# Patient Record
Sex: Female | Born: 1955
Health system: Southern US, Community
[De-identification: ages and names within clinical notes are randomized; demographics above are authoritative.]

## PROBLEM LIST (undated history)

## (undated) DIAGNOSIS — I10 Essential (primary) hypertension: Secondary | ICD-10-CM

## (undated) DIAGNOSIS — M1990 Unspecified osteoarthritis, unspecified site: Secondary | ICD-10-CM

## (undated) DIAGNOSIS — E785 Hyperlipidemia, unspecified: Secondary | ICD-10-CM

## (undated) DIAGNOSIS — B009 Herpesviral infection, unspecified: Secondary | ICD-10-CM

## (undated) DIAGNOSIS — H269 Unspecified cataract: Secondary | ICD-10-CM

## (undated) DIAGNOSIS — M8580 Other specified disorders of bone density and structure, unspecified site: Secondary | ICD-10-CM

## (undated) DIAGNOSIS — T7840XA Allergy, unspecified, initial encounter: Secondary | ICD-10-CM

## (undated) HISTORY — PX: LASIK: SHX215

## (undated) HISTORY — DX: Hyperlipidemia, unspecified: E78.5

## (undated) HISTORY — DX: Essential (primary) hypertension: I10

## (undated) HISTORY — PX: HYSTEROSCOPY: SHX211

## (undated) HISTORY — DX: Herpesviral infection, unspecified: B00.9

## (undated) HISTORY — DX: Unspecified cataract: H26.9

## (undated) HISTORY — DX: Other specified disorders of bone density and structure, unspecified site: M85.80

## (undated) HISTORY — DX: Unspecified osteoarthritis, unspecified site: M19.90

## (undated) HISTORY — DX: Allergy, unspecified, initial encounter: T78.40XA

## (undated) HISTORY — PX: COLONOSCOPY: SHX174

## (undated) HISTORY — PX: DILATION AND CURETTAGE OF UTERUS: SHX78

---

## 1997-04-30 HISTORY — PX: TUBAL LIGATION: SHX77

## 1999-01-10 ENCOUNTER — Other Ambulatory Visit: Admission: RE | Admit: 1999-01-10 | Discharge: 1999-01-10 | Payer: Self-pay | Admitting: Obstetrics and Gynecology

## 2000-01-15 ENCOUNTER — Other Ambulatory Visit: Admission: RE | Admit: 2000-01-15 | Discharge: 2000-01-15 | Payer: Self-pay | Admitting: Obstetrics and Gynecology

## 2000-03-04 ENCOUNTER — Encounter: Payer: Self-pay | Admitting: Obstetrics and Gynecology

## 2000-03-08 ENCOUNTER — Encounter (INDEPENDENT_AMBULATORY_CARE_PROVIDER_SITE_OTHER): Payer: Self-pay | Admitting: Specialist

## 2000-03-08 ENCOUNTER — Ambulatory Visit (HOSPITAL_COMMUNITY): Admission: RE | Admit: 2000-03-08 | Discharge: 2000-03-08 | Payer: Self-pay | Admitting: Obstetrics and Gynecology

## 2000-10-25 ENCOUNTER — Encounter: Admission: RE | Admit: 2000-10-25 | Discharge: 2000-10-25 | Payer: Self-pay | Admitting: Family Medicine

## 2000-10-25 ENCOUNTER — Encounter: Payer: Self-pay | Admitting: Family Medicine

## 2001-01-15 ENCOUNTER — Other Ambulatory Visit: Admission: RE | Admit: 2001-01-15 | Discharge: 2001-01-15 | Payer: Self-pay | Admitting: Obstetrics and Gynecology

## 2001-04-07 ENCOUNTER — Encounter: Admission: RE | Admit: 2001-04-07 | Discharge: 2001-04-07 | Payer: Self-pay | Admitting: Family Medicine

## 2001-04-07 ENCOUNTER — Encounter: Payer: Self-pay | Admitting: Family Medicine

## 2002-01-16 ENCOUNTER — Other Ambulatory Visit: Admission: RE | Admit: 2002-01-16 | Discharge: 2002-01-16 | Payer: Self-pay | Admitting: Obstetrics and Gynecology

## 2002-04-30 HISTORY — PX: VAGINAL HYSTERECTOMY: SUR661

## 2002-08-13 ENCOUNTER — Encounter: Payer: Self-pay | Admitting: Obstetrics and Gynecology

## 2002-08-20 ENCOUNTER — Encounter (INDEPENDENT_AMBULATORY_CARE_PROVIDER_SITE_OTHER): Payer: Self-pay | Admitting: Specialist

## 2002-08-21 ENCOUNTER — Inpatient Hospital Stay (HOSPITAL_COMMUNITY): Admission: RE | Admit: 2002-08-21 | Discharge: 2002-08-22 | Payer: Self-pay | Admitting: Obstetrics and Gynecology

## 2003-01-18 ENCOUNTER — Other Ambulatory Visit: Admission: RE | Admit: 2003-01-18 | Discharge: 2003-01-18 | Payer: Self-pay | Admitting: Obstetrics and Gynecology

## 2004-01-25 ENCOUNTER — Other Ambulatory Visit: Admission: RE | Admit: 2004-01-25 | Discharge: 2004-01-25 | Payer: Self-pay | Admitting: Obstetrics and Gynecology

## 2005-01-25 ENCOUNTER — Other Ambulatory Visit: Admission: RE | Admit: 2005-01-25 | Discharge: 2005-01-25 | Payer: Self-pay | Admitting: Obstetrics and Gynecology

## 2006-01-30 ENCOUNTER — Other Ambulatory Visit: Admission: RE | Admit: 2006-01-30 | Discharge: 2006-01-30 | Payer: Self-pay | Admitting: Obstetrics and Gynecology

## 2006-07-09 ENCOUNTER — Encounter: Admission: RE | Admit: 2006-07-09 | Discharge: 2006-07-09 | Payer: Self-pay | Admitting: Family Medicine

## 2007-02-03 ENCOUNTER — Other Ambulatory Visit: Admission: RE | Admit: 2007-02-03 | Discharge: 2007-02-03 | Payer: Self-pay | Admitting: Obstetrics and Gynecology

## 2007-06-05 ENCOUNTER — Encounter: Admission: RE | Admit: 2007-06-05 | Discharge: 2007-06-05 | Payer: Self-pay | Admitting: Family Medicine

## 2008-02-04 ENCOUNTER — Encounter: Payer: Self-pay | Admitting: Obstetrics and Gynecology

## 2008-02-04 ENCOUNTER — Ambulatory Visit: Payer: Self-pay | Admitting: Obstetrics and Gynecology

## 2008-02-04 ENCOUNTER — Other Ambulatory Visit: Admission: RE | Admit: 2008-02-04 | Discharge: 2008-02-04 | Payer: Self-pay | Admitting: Obstetrics and Gynecology

## 2008-03-15 ENCOUNTER — Ambulatory Visit: Payer: Self-pay | Admitting: Obstetrics and Gynecology

## 2009-02-07 ENCOUNTER — Ambulatory Visit: Payer: Self-pay | Admitting: Obstetrics and Gynecology

## 2009-02-07 ENCOUNTER — Other Ambulatory Visit: Admission: RE | Admit: 2009-02-07 | Discharge: 2009-02-07 | Payer: Self-pay | Admitting: Obstetrics and Gynecology

## 2009-02-07 ENCOUNTER — Encounter: Payer: Self-pay | Admitting: Obstetrics and Gynecology

## 2009-03-07 ENCOUNTER — Encounter (INDEPENDENT_AMBULATORY_CARE_PROVIDER_SITE_OTHER): Payer: Self-pay | Admitting: *Deleted

## 2009-03-17 ENCOUNTER — Encounter (INDEPENDENT_AMBULATORY_CARE_PROVIDER_SITE_OTHER): Payer: Self-pay | Admitting: *Deleted

## 2009-03-21 ENCOUNTER — Ambulatory Visit: Payer: Self-pay | Admitting: Gastroenterology

## 2009-03-21 ENCOUNTER — Encounter (INDEPENDENT_AMBULATORY_CARE_PROVIDER_SITE_OTHER): Payer: Self-pay | Admitting: *Deleted

## 2009-03-30 ENCOUNTER — Ambulatory Visit: Payer: Self-pay | Admitting: Gastroenterology

## 2010-02-08 ENCOUNTER — Ambulatory Visit: Payer: Self-pay | Admitting: Obstetrics and Gynecology

## 2010-02-08 ENCOUNTER — Other Ambulatory Visit: Admission: RE | Admit: 2010-02-08 | Discharge: 2010-02-08 | Payer: Self-pay | Admitting: Obstetrics and Gynecology

## 2010-04-11 ENCOUNTER — Ambulatory Visit: Payer: Self-pay | Admitting: Obstetrics and Gynecology

## 2010-08-01 LAB — GLUCOSE, CAPILLARY
Glucose-Capillary: 147 mg/dL — ABNORMAL HIGH (ref 70–99)
Glucose-Capillary: 168 mg/dL — ABNORMAL HIGH (ref 70–99)

## 2010-09-15 NOTE — Op Note (Signed)
   [name], [name]                            ACCOUNT NO.:  [id number]   MEDICAL RECORD NO.:  [id number]                   PATIENT TYPE:  AMB   LOCATION:  DAY                                  FACILITY:  [facility]   PHYSICIAN:  Daniel L. Eda Paschal, M.D.           DATE OF BIRTH:  May 30, 1955   DATE OF PROCEDURE:  08/20/2002  DATE OF DISCHARGE:                                 OPERATIVE REPORT   PREOPERATIVE DIAGNOSES:  Menometorrhagia with fibroid.   POSTOPERATIVE DIAGNOSES:  Menometorrhagia with fibroid.   OPERATION:  Vaginal hysterectomy.   SURGEON:  Daniel L. Eda Paschal, M.D.   FIRST ASSISTANT:  Raynald Kemp, M.D.   ANESTHESIA:  General endotracheal.   FINDINGS:  At the time of surgery, the patient's uterus was boggy and  enlarged consistent with her findings on ultrasound of a submucous myoma.  Ovaries, fallopian tubes and pelvic peritoneum were free of any disease.   DESCRIPTION OF PROCEDURE:  After adequate general endotracheal anesthesia,  the patient was placed in the dorsal lithotomy position, prepped and draped  in the usual sterile manner. A 360 degree incision was made around the  cervix after 0.5% Xylocaine with 1:200,000 epinephrine was injected around  the cervix. Both the vesicouterine fold to peritoneum and posterior  peritoneum were entered by sharp dissection. A Foley catheter was placed in  the bladder which drained clear urine. At this point after the bladder flap  had been opened, the uterosacral ligaments were clamped, in clamping them  they were shortened. They were sutured to the vault laterally for good vault  support. The cardinal ligaments, uterine arteries, balance of the broad  ligaments, uteroovarian ligaments and round ligaments and fallopian tubes  were successfully clamped, cut and suture ligated. All major vascular  bundles were doubly ligated, suture material was #1 chromic catgut. The  uterus was sent to pathology for tissue diagnosis.  Irrigation was done with  Ringer's lactate. Two sponge, needle and instrument counts were correct. The  peritoneum was sutured to the vaginal cuff with a running locking #0 Vicryl.  The peritoneum and the cuff were closed with figure-of-eights of #1 chromic  catgut taking care to completely obliterate the cul-de-sac. Two sponge,  needle and instrument counts were correct. Estimated blood loss was 50 mL  with none replaced. The patient tolerated the procedure well and left the  operating room in satisfactory condition draining clear urine from her Foley  catheter.                                               Daniel L. Eda Paschal, M.D.    Tonette Bihari  D:  08/20/2002  T:  08/20/2002  Job:  [id number]

## 2010-09-15 NOTE — Discharge Summary (Signed)
   [name], [name]                            ACCOUNT NO.:  [id number]   MEDICAL RECORD NO.:  [id number]                   PATIENT TYPE:  INP   LOCATION:  0460                                 FACILITY:  [facility]   PHYSICIAN:  Rande Brunt. Eda Paschal, M.D.           DATE OF BIRTH:  Sep 03, 1955   DATE OF ADMISSION:  08/20/2002  DATE OF DISCHARGE:  08/22/2002                                 DISCHARGE SUMMARY   HOSPITAL COURSE:  The patient is a 55 year old female who was admitted to  the hospital with multiple myomas and dysfunctional uterine bleeding not  responsive to medical therapy. On the day of admission, she was taken to the  operating room, a vaginal hysterectomy was performed. Postoperatively, she  had some trouble with nausea and inability to tolerate a p.o. diet. IV's  were continued and by the second postoperative day, all of this had cleared,  she was eating well, voiding well and was ready for discharge.   DISCHARGE DIET:  Regular.   DISCHARGE ACTIVITIES:  Ambulatory.   CONDITION ON DISCHARGE:  Improved.   DISCHARGE MEDICATIONS:  Tylox for pain relief.   Final pathology report is not in chart at time of dictation.   DISCHARGE DIAGNOSIS:  Dysfunctional uterine bleeding with leiomyoma.   OPERATION:  Vaginal hysterectomy and excision of small paraovarian cyst.                                               Daniel L. Eda Paschal, M.D.    [name]  D:  08/22/2002  T:  08/22/2002  Job:  [id number]

## 2010-09-15 NOTE — H&P (Signed)
NAMEJESSAMINE, [name]                            ACCOUNT NO.:  [id number]   MEDICAL RECORD NO.:  [id number]                   PATIENT TYPE:  OBV   LOCATION:  0460                                 FACILITY:  [facility]   PHYSICIAN:  Rande Brunt. Eda Paschal, M.D.           DATE OF BIRTH:  November 01, 1955   DATE OF ADMISSION:  08/20/2002  DATE OF DISCHARGE:                                HISTORY & PHYSICAL   CHIEF COMPLAINT:  Menometorrhagia with myoma.   HISTORY OF PRESENT ILLNESS:  The patient is a 55 year old gravida I, para 0,  AB 1 with a history of severe menometorrhagia. It will last 8-10 days. It is  exceedingly heavy and it also is associated with significant dysmenorrhea.  It has not responded to oral progestins and as a result of that, she  underwent saline infusion histogram which shows a large submucous myoma  which is not confined to the cavity. It is felt that that is the source of  the above. She is now admitted to [facility] for vaginal  hysterectomy for treatment of the above. She understands that there is a  small chance that she will need an incision. We will save her ovaries unless  there is any abnormality as per her desires. Endometrial biopsy was also  done because of the menometorrhagia and did not show hyperplasia or  malignancy. It should be noted that the patient had a previous history of  DUB. Had undergone a hysteroscopy and D&C in November of 2001 with excision  of an endometrial polyp. At that time, she also had a submucous myomata  which was not in the cavity but it has grown significantly since then.   PAST MEDICAL HISTORY:  The patient is hypertensive on Accupril 40 mg daily.   ALLERGIES:  PENICILLIN.   FAMILY HISTORY:  Mother, father, brother, and aunt are all diabetic. Mother  is hypertensive and also has coronary artery disease.   SOCIAL HISTORY:  She drinks a couple times per week. Does not use tobacco.   REVIEW OF SYSTEMS:  HEENT:  Negative. Cardiac: Reveals hypertension.  Respiratory is negative. GI is negative. GU is negative. Neuro and  psychiatric is negative. Immunologic, lymphatic and endocrine is negative.   PHYSICAL EXAMINATION:  GENERAL: A well developed, well nourished female in  no acute distress.  VITAL SIGNS: Blood pressure 130/70, pulse 80 and regular. Respiratory rate  16 and non-labored. She is afebrile.  HEENT: Within normal limits.  NECK: Supple. Trachea midline. Thyroid not enlarged.  LUNGS: Clear to auscultation and percussion.  HEART: No thrills, heaves, or murmurs.  BREAST: No masses.  ABDOMEN: Soft without guarding, rebound, or masses.  PELVIC: External and vaginal within normal limits. Cervix clean. PAP smear  shows no atypia. Uterus slightly enlarged. Adnexa are palpably normal.  RECTAL: Negative.    ADMISSION IMPRESSION:  Menometorrhagia, submucous myoma not confined to  cavity.  PLAN:  Vaginal hysterectomy.                                               Daniel L. Eda Paschal, M.D.    Tonette Bihari  D:  08/20/2002  T:  08/20/2002  Job:  [id number]

## 2010-09-15 NOTE — Op Note (Signed)
[facility]  Patient:    [name], [name]                           MRN: [id number] Proc. Date: 03/08/00 Adm. Date:  [id number] Attending:  Sharon Mt                           Operative Report  PREOPERATIVE DIAGNOSIS:  Menorrhagia with intrauterine lesion.  POSTOPERATIVE DIAGNOSIS:  Menorrhagia with intrauterine lesion.  OPERATION:  Hysteroscopy with resection of intrauterine lesion.  SURGEON:  Daniel L. Eda Paschal, M.D.  ANESTHESIA:  General.  INDICATIONS:  The patient is a 55 year old female who presented to the office with severe menorrhagia.  She underwent a sonohistogram in the office, and she has a filling defect in her endometrial cavity of about 2 cm coming off the posterior wall.  It is consistent with either a submucous myoma or an endometrial polyp.  She now enters the hospital for hysteroscopy and resection of the above.  FINDINGS:  External and vaginal exam within normal limits.  Cervix is clean. Uterus is normal size and shape.  There is first degree uterine decensus. Adnexa are palpably normal.  At the time of hysteroscopy, the patient had a large intracavitary lesion that was confined to the endometrial cavity of about 2+ cm.  It was on the posterior wall of the fundus.  Its consistency was firm, and it was felt that it possibly was a myoma rather than endometrial polyp.  Once it was resected, the top of the fundus, tubal ostia, anterior and posterior walls of the fundus, lower uterine segment, and the cervical canal were all free of any disease.  DESCRIPTION OF PROCEDURE:  After adequate general endotracheal anesthesia, the patient was placed in the dorsosupine position and prepped and draped in the usual sterile manner.  The cervix was dilated to a #35 Pratt dilator, and hysteroscopic resectoscope was introduced.  It was attached to a camera for magnification to improve intrauterine pressure, and 3% Sorbitol was used  to expand the intrauterine cavity.  An excellent view was obtained, and a large lesion coming off the posterior wall was seen as described above.  Pictures were taken for documentation.  Using a 90 degree wire loop with setting 70 coag, 110 cutting, blend 1, the lesion was resected in pieces and then sent to pathology for tissue diagnosis.  The base was cauterized to stop bleeding.  At the termination of the procedure, there was no bleeding noted, and there were no other lesions present in the cavity.  Estimated blood loss for the entire procedure was 50 cc with none replaced.  Fluid deficit was approximately 100 cc.  The patient tolerated the procedure well and left the operating room in satisfactory condition. DD:  03/08/00 TD:  03/08/00 Job: [id number]

## 2011-02-07 DIAGNOSIS — D219 Benign neoplasm of connective and other soft tissue, unspecified: Secondary | ICD-10-CM | POA: Insufficient documentation

## 2011-02-20 ENCOUNTER — Encounter: Payer: Self-pay | Admitting: Obstetrics and Gynecology

## 2011-02-20 ENCOUNTER — Ambulatory Visit (INDEPENDENT_AMBULATORY_CARE_PROVIDER_SITE_OTHER): Payer: 59 | Admitting: Obstetrics and Gynecology

## 2011-02-20 ENCOUNTER — Other Ambulatory Visit (HOSPITAL_COMMUNITY)
Admission: RE | Admit: 2011-02-20 | Discharge: 2011-02-20 | Disposition: A | Discharge status: Home / Self Care | Payer: 59 | Source: Ambulatory Visit | Attending: Obstetrics and Gynecology | Admitting: Obstetrics and Gynecology

## 2011-02-20 VITALS — BP 156/90 | Ht 64.5 in | Wt 216.0 lb

## 2011-02-20 DIAGNOSIS — Z01419 Encounter for gynecological examination (general) (routine) without abnormal findings: Secondary | ICD-10-CM | POA: Insufficient documentation

## 2011-02-20 DIAGNOSIS — I10 Essential (primary) hypertension: Secondary | ICD-10-CM | POA: Insufficient documentation

## 2011-02-20 DIAGNOSIS — Z780 Asymptomatic menopausal state: Secondary | ICD-10-CM

## 2011-02-20 DIAGNOSIS — N951 Menopausal and female climacteric states: Secondary | ICD-10-CM

## 2011-02-20 DIAGNOSIS — M949 Disorder of cartilage, unspecified: Secondary | ICD-10-CM

## 2011-02-20 DIAGNOSIS — Z23 Encounter for immunization: Secondary | ICD-10-CM

## 2011-02-20 DIAGNOSIS — M8580 Other specified disorders of bone density and structure, unspecified site: Secondary | ICD-10-CM

## 2011-02-20 DIAGNOSIS — M899 Disorder of bone, unspecified: Secondary | ICD-10-CM

## 2011-02-20 MED ORDER — ESTRADIOL 0.0375 MG/24HR TD PTTW: 1.0000 | MEDICATED_PATCH | TRANSDERMAL | Status: DC

## 2011-02-20 NOTE — Progress Notes (Signed)
Patient came to see me today for her annual GYN exam. She's been struggling with hot flashes and mood swings. She is tried over-the-counter medication but is now working. She has low bone mass but it is improved on her last bone density and she does not have an elevated FRAX risk. She takes calcium and vitamin D. She's had no fractures. She has her yearly mammogram scheduled for tomorrow.  ROS: Pertinent positives include hypertension and diabetes. She is on a statin but is for prevention and not elevated cholesterol.  Physical examination: Kennon Portela present HEENT within normal limits. Neck: Thyroid not large. No masses. Supraclavicular nodes: not enlarged. Breasts: Examined in both sitting midline position. No skin changes and no masses. Abdomen: Soft no guarding rebound or masses or hernia. Pelvic: External: Within normal limits. BUS: Within normal limits. Vaginal:within normal limits. Good estrogen effect. No evidence of cystocele rectocele or enterocele. Cervix and uterus surgically absent.  Adnexa: No masses. Rectovaginal exam: Confirmatory and negative. Extremities: Within normal limits.  Assessment: Menopausal symptoms  Plan: Patient started Vivelle dot patch 0.0375 mg twice a week. She knows to call if a higher dose as needed.

## 2011-02-21 ENCOUNTER — Encounter: Payer: Self-pay | Admitting: Obstetrics and Gynecology

## 2012-02-25 ENCOUNTER — Encounter: Payer: Self-pay | Admitting: Obstetrics and Gynecology

## 2012-02-25 ENCOUNTER — Ambulatory Visit (INDEPENDENT_AMBULATORY_CARE_PROVIDER_SITE_OTHER): Payer: 59 | Admitting: Obstetrics and Gynecology

## 2012-02-25 VITALS — BP 156/94 | Ht 64.5 in | Wt 200.0 lb

## 2012-02-25 DIAGNOSIS — Z01419 Encounter for gynecological examination (general) (routine) without abnormal findings: Secondary | ICD-10-CM

## 2012-02-25 DIAGNOSIS — Z23 Encounter for immunization: Secondary | ICD-10-CM

## 2012-02-25 DIAGNOSIS — R232 Flushing: Secondary | ICD-10-CM

## 2012-02-25 DIAGNOSIS — N951 Menopausal and female climacteric states: Secondary | ICD-10-CM

## 2012-02-25 MED ORDER — ESTRADIOL 0.0375 MG/24HR TD PTTW: 1.0000 | MEDICATED_PATCH | TRANSDERMAL | Status: DC

## 2012-02-25 NOTE — Addendum Note (Signed)
Addended by: Dayna Barker on: 02/25/2012 10:59 AM   Modules accepted: Orders

## 2012-02-25 NOTE — Patient Instructions (Signed)
Mammogram today. Monitor blood pressure at home.

## 2012-02-25 NOTE — Progress Notes (Signed)
Patient came to see me today for her annual GYN exam. Last year we started her on an estrogen patch for hot flashes which has worked very well. She is status post vaginal hysterectomy done in 2004 for  menorrhagia and fibroids. She has never had an abnormal Pap smear. Her last Pap smear was 2012. She does lab through her PCP. She is having her yearly mammogram today. She has had 3 bone densities. She does have low bone mass but her last bone density in 2011 was better and it is minimal. Her fracture risk is not elevated. She's had no fractures.  HEENT: Within normal limits.Kennon Portela present. Neck: No masses. Supraclavicular lymph nodes: Not enlarged. Breasts: Examined in both sitting and lying position. Symmetrical without skin changes or masses. Abdomen: Soft no masses guarding or rebound. No hernias. Pelvic: External within normal limits. BUS within normal limits. Vaginal examination shows good estrogen effect, no cystocele enterocele or rectocele. Cervix and uterus absent. Adnexa within normal limits. Rectovaginal confirmatory. Extremities within normal limits.  Assessment: Vasomotor symptoms  Plan: Continue estrogen patch. Continue yearly mammograms. Plan bone density for next year. Pap  not done.The new Pap smear guidelines were discussed with the patient.

## 2012-02-26 ENCOUNTER — Encounter: Payer: Self-pay | Admitting: Obstetrics and Gynecology

## 2012-02-26 LAB — URINALYSIS W MICROSCOPIC + REFLEX CULTURE
Glucose, UA: NEGATIVE mg/dL
Hgb urine dipstick: NEGATIVE
Leukocytes, UA: NEGATIVE
Protein, ur: NEGATIVE mg/dL

## 2013-01-14 ENCOUNTER — Other Ambulatory Visit: Payer: Self-pay | Admitting: Obstetrics and Gynecology

## 2013-02-27 ENCOUNTER — Encounter: Payer: Self-pay | Admitting: Gynecology

## 2013-02-27 ENCOUNTER — Ambulatory Visit (INDEPENDENT_AMBULATORY_CARE_PROVIDER_SITE_OTHER): Payer: 59 | Admitting: Gynecology

## 2013-02-27 VITALS — BP 120/78 | Ht 64.0 in | Wt 197.0 lb

## 2013-02-27 DIAGNOSIS — M899 Disorder of bone, unspecified: Secondary | ICD-10-CM

## 2013-02-27 DIAGNOSIS — Z79890 Hormone replacement therapy (postmenopausal): Secondary | ICD-10-CM

## 2013-02-27 DIAGNOSIS — Z01419 Encounter for gynecological examination (general) (routine) without abnormal findings: Secondary | ICD-10-CM

## 2013-02-27 DIAGNOSIS — M8580 Other specified disorders of bone density and structure, unspecified site: Secondary | ICD-10-CM

## 2013-02-27 DIAGNOSIS — Z23 Encounter for immunization: Secondary | ICD-10-CM

## 2013-02-27 DIAGNOSIS — N951 Menopausal and female climacteric states: Secondary | ICD-10-CM

## 2013-02-27 MED ORDER — ESTRADIOL 0.0375 MG/24HR TD PTTW: MEDICATED_PATCH | TRANSDERMAL | Status: DC

## 2013-02-27 NOTE — Progress Notes (Signed)
[name] [birth year] [id number]        57 y.o.  G1P0010 for annual exam.  Former patient of Dr. Eda Paschal. Several issues noted below.  Past medical history,surgical history, problem list, medications, allergies, family history and social history were all reviewed and documented in the EPIC chart.  ROS:  Performed and pertinent positives and negatives are included in the history, assessment and plan .  Exam: [name] Filed Vitals:   02/27/13 1057  BP: 120/78  Height: 5\' 4"  (1.626 m)  Weight: 197 lb (89.359 kg)   General appearance  Normal Skin grossly normal Head/Neck normal with no cervical or supraclavicular adenopathy thyroid normal Lungs  clear Cardiac RR, without RMG Abdominal  soft, nontender, without masses, organomegaly or hernia Breasts  examined lying and sitting without masses, retractions, discharge or axillary adenopathy. Pelvic  Ext/BUS/vagina  normal with mild atrophic changes   Adnexa  Without masses or tenderness    Anus and perineum  normal   Rectovaginal  normal sphincter tone without palpated masses or tenderness.    Assessment/Plan:  57 y.o. G1P0010 female for annual exam.   1. Menopausal symptoms status post [facility] 2004. Currently on Minivelle 0.0375 doing well. Started because of hot flashes, sleep disturbances and irritability which were all controlled on the patch.  I reviewed the whole issue of HRT with her to include the WHI study with increased risk of stroke, heart attack, DVT and breast cancer. The ACOG and NAMS statements for lowest dose for the shortest period of time reviewed. Transdermal versus oral first-pass effect benefit discussed. Patient understands the above and wants to continue and I refilled her x1 year. 2. Osteopenia. DEXA 03/2010 with T score -1.3 FRAX 5.5%/0.3%. Repeat DEXA now and patient will schedule. Increase calcium vitamin D reviewed. 3. Mammography scheduled today. Continue with annual mammography. SBE monthly  reviewed. 4. Pap smear 2012. No Pap smear done today. No history of significant abnormal Pap smears. Review current screening guidelines. Options to stop screening altogether or less frequent screening intervals. 5. Colonoscopy 2010. Repeat at their recommended interval. 6. Health maintenance. No blood work done today as is all done through her other physician's offices who follows her for her medical issues. Followup one year, sooner as needed.  Note: This document was prepared with digital dictation and possible smart phrase technology. Any transcriptional errors that result from this process are unintentional.   [name] MD, 11:21 AM 02/27/2013

## 2013-02-27 NOTE — Patient Instructions (Signed)
Follow up in one year for annual exam 

## 2013-02-27 NOTE — Addendum Note (Signed)
Addended by: Dayna Barker on: 02/27/2013 12:12 PM   Modules accepted: Orders

## 2013-02-28 LAB — URINALYSIS W MICROSCOPIC + REFLEX CULTURE
Bacteria, UA: NONE SEEN
Bilirubin Urine: NEGATIVE
Casts: NONE SEEN
Crystals: NONE SEEN
Glucose, UA: NEGATIVE mg/dL
Hgb urine dipstick: NEGATIVE
Ketones, ur: NEGATIVE mg/dL
pH: 6 (ref 5.0–8.0)

## 2013-03-02 ENCOUNTER — Encounter: Payer: Self-pay | Admitting: Gynecology

## 2013-03-03 ENCOUNTER — Other Ambulatory Visit: Payer: Self-pay | Admitting: *Deleted

## 2013-03-03 DIAGNOSIS — R928 Other abnormal and inconclusive findings on diagnostic imaging of breast: Secondary | ICD-10-CM

## 2013-03-12 ENCOUNTER — Other Ambulatory Visit: Payer: Self-pay | Admitting: Gynecology

## 2013-03-12 DIAGNOSIS — M8580 Other specified disorders of bone density and structure, unspecified site: Secondary | ICD-10-CM

## 2013-03-13 ENCOUNTER — Other Ambulatory Visit: Payer: Self-pay | Admitting: *Deleted

## 2013-03-13 DIAGNOSIS — R928 Other abnormal and inconclusive findings on diagnostic imaging of breast: Secondary | ICD-10-CM

## 2013-03-30 DIAGNOSIS — M8580 Other specified disorders of bone density and structure, unspecified site: Secondary | ICD-10-CM

## 2013-03-30 HISTORY — DX: Other specified disorders of bone density and structure, unspecified site: M85.80

## 2013-04-21 ENCOUNTER — Ambulatory Visit (INDEPENDENT_AMBULATORY_CARE_PROVIDER_SITE_OTHER): Payer: 59

## 2013-04-21 ENCOUNTER — Encounter: Payer: Self-pay | Admitting: Gynecology

## 2013-04-21 DIAGNOSIS — M8580 Other specified disorders of bone density and structure, unspecified site: Secondary | ICD-10-CM

## 2013-04-21 DIAGNOSIS — M899 Disorder of bone, unspecified: Secondary | ICD-10-CM

## 2014-03-01 ENCOUNTER — Encounter: Payer: Self-pay | Admitting: Gynecology

## 2014-03-01 ENCOUNTER — Ambulatory Visit (INDEPENDENT_AMBULATORY_CARE_PROVIDER_SITE_OTHER): Payer: 59 | Admitting: Gynecology

## 2014-03-01 ENCOUNTER — Other Ambulatory Visit (HOSPITAL_COMMUNITY)
Admission: RE | Admit: 2014-03-01 | Discharge: 2014-03-01 | Disposition: A | Discharge status: Home / Self Care | Payer: 59 | Source: Ambulatory Visit | Attending: Gynecology | Admitting: Gynecology

## 2014-03-01 VITALS — BP 134/80 | Ht 65.0 in | Wt 205.0 lb

## 2014-03-01 DIAGNOSIS — Z01419 Encounter for gynecological examination (general) (routine) without abnormal findings: Secondary | ICD-10-CM

## 2014-03-01 DIAGNOSIS — M8580 Other specified disorders of bone density and structure, unspecified site: Secondary | ICD-10-CM

## 2014-03-01 DIAGNOSIS — Z79890 Hormone replacement therapy (postmenopausal): Secondary | ICD-10-CM

## 2014-03-01 MED ORDER — ESTRADIOL 0.0375 MG/24HR TD PTTW: MEDICATED_PATCH | TRANSDERMAL | Status: DC

## 2014-03-01 NOTE — Addendum Note (Signed)
Addended by: Nelva Nay on: 03/01/2014 09:39 AM   Modules accepted: Orders, SmartSet

## 2014-03-01 NOTE — Progress Notes (Signed)
[name] 12-Mar-1956 [id number]        58 y.o.  G1P0010 for annual exam.  Doing well. Several issues noted below.  Past medical history,surgical history, problem list, medications, allergies, family history and social history were all reviewed and documented as reviewed in the EPIC chart.  ROS:  12 system ROS performed with pertinent positives and negatives included in the history, assessment and plan.   Additional significant findings :  none   Exam: Kim Counsellor Vitals:   03/01/14 0909  BP: 134/80  Height: 5\' 5"  (1.651 m)  Weight: 205 lb (92.987 kg)   General appearance:  Normal affect, orientation and appearance. Skin: Grossly normal HEENT: Without gross lesions.  No cervical or supraclavicular adenopathy. Thyroid normal.  Lungs:  Clear without wheezing, rales or rhonchi Cardiac: RR, without RMG Abdominal:  Soft, nontender, without masses, guarding, rebound, organomegaly or hernia Breasts:  Examined lying and sitting without masses, retractions, discharge or axillary adenopathy. Pelvic:  Ext/BUS/vagina with atrophic changes. Pap of cuff done  Adnexa  Without masses or tenderness    Anus and perineum  Normal   Rectovaginal  Normal sphincter tone without palpated masses or tenderness.    Assessment/Plan:  58 y.o. G1P0010 female for annual exam.   1. Postmenopausal/menopausal symptoms/HRT. Status post [facility] 2004. On minivelle 0.0375 doing well. Started because of hot flashes and sleep disturbances as well as irritability. Once to continue.  I again reviewed the whole issue of HRT with her to include the WHI study with increased risk of stroke, heart attack, DVT and breast cancer. The ACOG and NAMS statements for lowest dose for the shortest period of time reviewed. Transdermal versus oral first-pass effect benefit discussed.  Patient understands and accepts risks and I refilled her 1 year. 2. Osteopenia. DEXA 03/2013 T score -1.2 FRAX 6.2%/0.4%. Check vitamin D level today.  Stable from prior DEXA. Plan repeat at 3-5 year interval. Increased calcium and vitamin D recommendations reviewed 3. Pap smear 2012. Pap of vaginal cuff today. No history of abnormal Pap smears previously. Options to stop screening altogether as she is status post hysterectomy for benign indications reviewed per current screening guidelines. Will readdress on an annual basis. 4. Mammography scheduled end of this week. Continue with annual mammography. SBE monthly reviewed. 5. Colonoscopy 2010. Repeat at their recommended interval. 6. Health maintenance. No routine blood work done as she reports this done at her primary physician's office. Follow up 1 year, sooner as needed.    [name] MD, 9:29 AM 03/01/2014

## 2014-03-01 NOTE — Patient Instructions (Signed)
You may obtain a copy of any labs that were done today by logging onto MyChart as outlined in the instructions provided with your AVS (after visit summary). The office will not call with normal lab results but certainly if there are any significant abnormalities then we will contact you.   Health Maintenance, Female A healthy lifestyle and preventative care can promote health and wellness.  Maintain regular health, dental, and eye exams.  Eat a healthy diet. Foods like vegetables, fruits, whole grains, low-fat dairy products, and lean protein foods contain the nutrients you need without too many calories. Decrease your intake of foods high in solid fats, added sugars, and salt. Get information about a proper diet from your caregiver, if necessary.  Regular physical exercise is one of the most important things you can do for your health. Most adults should get at least 150 minutes of moderate-intensity exercise (any activity that increases your heart rate and causes you to sweat) each week. In addition, most adults need muscle-strengthening exercises on 2 or more days a week.   Maintain a healthy weight. The body mass index (BMI) is a screening tool to identify possible weight problems. It provides an estimate of body fat based on height and weight. Your caregiver can help determine your BMI, and can help you achieve or maintain a healthy weight. For adults 20 years and older:  A BMI below 18.5 is considered underweight.  A BMI of 18.5 to 24.9 is normal.  A BMI of 25 to 29.9 is considered overweight.  A BMI of 30 and above is considered obese.  Maintain normal blood lipids and cholesterol by exercising and minimizing your intake of saturated fat. Eat a balanced diet with plenty of fruits and vegetables. Blood tests for lipids and cholesterol should begin at age 61 and be repeated every 5 years. If your lipid or cholesterol levels are high, you are over 50, or you are a high risk for heart  disease, you may need your cholesterol levels checked more frequently.Ongoing high lipid and cholesterol levels should be treated with medicines if diet and exercise are not effective.  If you smoke, find out from your caregiver how to quit. If you do not use tobacco, do not start.  Lung cancer screening is recommended for adults aged 33 80 years who are at high risk for developing lung cancer because of a history of smoking. Yearly low-dose computed tomography (CT) is recommended for people who have at least a 30-pack-year history of smoking and are a current smoker or have quit within the past 15 years. A pack year of smoking is smoking an average of 1 pack of cigarettes a day for 1 year (for example: 1 pack a day for 30 years or 2 packs a day for 15 years). Yearly screening should continue until the smoker has stopped smoking for at least 15 years. Yearly screening should also be stopped for people who develop a health problem that would prevent them from having lung cancer treatment.  If you are pregnant, do not drink alcohol. If you are breastfeeding, be very cautious about drinking alcohol. If you are not pregnant and choose to drink alcohol, do not exceed 1 drink per day. One drink is considered to be 12 ounces (355 mL) of beer, 5 ounces (148 mL) of wine, or 1.5 ounces (44 mL) of liquor.  Avoid use of street drugs. Do not share needles with anyone. Ask for help if you need support or instructions about stopping  the use of drugs.  High blood pressure causes heart disease and increases the risk of stroke. Blood pressure should be checked at least every 1 to 2 years. Ongoing high blood pressure should be treated with medicines, if weight loss and exercise are not effective.  If you are 59 to 58 years old, ask your caregiver if you should take aspirin to prevent strokes.  Diabetes screening involves taking a blood sample to check your fasting blood sugar level. This should be done once every 3  years, after age 91, if you are within normal weight and without risk factors for diabetes. Testing should be considered at a younger age or be carried out more frequently if you are overweight and have at least 1 risk factor for diabetes.  Breast cancer screening is essential preventative care for women. You should practice "breast self-awareness." This means understanding the normal appearance and feel of your breasts and may include breast self-examination. Any changes detected, no matter how small, should be reported to a caregiver. Women in their 66s and 30s should have a clinical breast exam (CBE) by a caregiver as part of a regular health exam every 1 to 3 years. After age 101, women should have a CBE every year. Starting at age 100, women should consider having a mammogram (breast X-ray) every year. Women who have a family history of breast cancer should talk to their caregiver about genetic screening. Women at a high risk of breast cancer should talk to their caregiver about having an MRI and a mammogram every year.  Breast cancer gene (BRCA)-related cancer risk assessment is recommended for women who have family members with BRCA-related cancers. BRCA-related cancers include breast, ovarian, tubal, and peritoneal cancers. Having family members with these cancers may be associated with an increased risk for harmful changes (mutations) in the breast cancer genes BRCA1 and BRCA2. Results of the assessment will determine the need for genetic counseling and BRCA1 and BRCA2 testing.  The Pap test is a screening test for cervical cancer. Women should have a Pap test starting at age 57. Between ages 25 and 35, Pap tests should be repeated every 2 years. Beginning at age 37, you should have a Pap test every 3 years as long as the past 3 Pap tests have been normal. If you had a hysterectomy for a problem that was not cancer or a condition that could lead to cancer, then you no longer need Pap tests. If you are  between ages 50 and 76, and you have had normal Pap tests going back 10 years, you no longer need Pap tests. If you have had past treatment for cervical cancer or a condition that could lead to cancer, you need Pap tests and screening for cancer for at least 20 years after your treatment. If Pap tests have been discontinued, risk factors (such as a new sexual partner) need to be reassessed to determine if screening should be resumed. Some women have medical problems that increase the chance of getting cervical cancer. In these cases, your caregiver may recommend more frequent screening and Pap tests.  The human papillomavirus (HPV) test is an additional test that may be used for cervical cancer screening. The HPV test looks for the virus that can cause the cell changes on the cervix. The cells collected during the Pap test can be tested for HPV. The HPV test could be used to screen women aged 44 years and older, and should be used in women of any age  who have unclear Pap test results. After the age of 55, women should have HPV testing at the same frequency as a Pap test.  Colorectal cancer can be detected and often prevented. Most routine colorectal cancer screening begins at the age of 44 and continues through age 20. However, your caregiver may recommend screening at an earlier age if you have risk factors for colon cancer. On a yearly basis, your caregiver may provide home test kits to check for hidden blood in the stool. Use of a small camera at the end of a tube, to directly examine the colon (sigmoidoscopy or colonoscopy), can detect the earliest forms of colorectal cancer. Talk to your caregiver about this at age 86, when routine screening begins. Direct examination of the colon should be repeated every 5 to 10 years through age 13, unless early forms of pre-cancerous polyps or small growths are found.  Hepatitis C blood testing is recommended for all people born from [birth year] through 1965 and any  individual with known risks for hepatitis C.  Practice safe sex. Use condoms and avoid high-risk sexual practices to reduce the spread of sexually transmitted infections (STIs). Sexually active women aged 36 and younger should be checked for Chlamydia, which is a common sexually transmitted infection. Older women with new or multiple partners should also be tested for Chlamydia. Testing for other STIs is recommended if you are sexually active and at increased risk.  Osteoporosis is a disease in which the bones lose minerals and strength with aging. This can result in serious bone fractures. The risk of osteoporosis can be identified using a bone density scan. Women ages 20 and over and women at risk for fractures or osteoporosis should discuss screening with their caregivers. Ask your caregiver whether you should be taking a calcium supplement or vitamin D to reduce the rate of osteoporosis.  Menopause can be associated with physical symptoms and risks. Hormone replacement therapy is available to decrease symptoms and risks. You should talk to your caregiver about whether hormone replacement therapy is right for you.  Use sunscreen. Apply sunscreen liberally and repeatedly throughout the day. You should seek shade when your shadow is shorter than you. Protect yourself by wearing long sleeves, pants, a wide-brimmed hat, and sunglasses year round, whenever you are outdoors.  Notify your caregiver of new moles or changes in moles, especially if there is a change in shape or color. Also notify your caregiver if a mole is larger than the size of a pencil eraser.  Stay current with your immunizations. Document Released: 10/30/2010 Document Revised: 08/11/2012 Document Reviewed: 10/30/2010 [facility] Patient Information 2014 [place].

## 2014-03-02 LAB — URINALYSIS W MICROSCOPIC + REFLEX CULTURE
Bacteria, UA: NONE SEEN
Bilirubin Urine: NEGATIVE
CASTS: NONE SEEN
CRYSTALS: NONE SEEN
Glucose, UA: NEGATIVE mg/dL
Hgb urine dipstick: NEGATIVE
Ketones, ur: NEGATIVE mg/dL
LEUKOCYTES UA: NEGATIVE
NITRITE: NEGATIVE
Protein, ur: NEGATIVE mg/dL
SPECIFIC GRAVITY, URINE: 1.008 (ref 1.005–1.030)
Squamous Epithelial / LPF: NONE SEEN
UROBILINOGEN UA: 0.2 mg/dL (ref 0.0–1.0)
pH: 6 (ref 5.0–8.0)

## 2014-03-02 LAB — CYTOLOGY - PAP

## 2014-03-02 LAB — VITAMIN D 25 HYDROXY (VIT D DEFICIENCY, FRACTURES): VIT D 25 HYDROXY: 50 ng/mL (ref 30–89)

## 2014-03-05 ENCOUNTER — Encounter: Payer: Self-pay | Admitting: Gynecology

## 2014-09-29 ENCOUNTER — Ambulatory Visit (INDEPENDENT_AMBULATORY_CARE_PROVIDER_SITE_OTHER): Payer: 59 | Admitting: Gynecology

## 2014-09-29 ENCOUNTER — Encounter: Payer: Self-pay | Admitting: Gynecology

## 2014-09-29 VITALS — BP 130/80

## 2014-09-29 DIAGNOSIS — R319 Hematuria, unspecified: Secondary | ICD-10-CM | POA: Diagnosis not present

## 2014-09-29 DIAGNOSIS — N3001 Acute cystitis with hematuria: Secondary | ICD-10-CM | POA: Diagnosis not present

## 2014-09-29 LAB — URINALYSIS W MICROSCOPIC + REFLEX CULTURE
CASTS: NONE SEEN
CRYSTALS: NONE SEEN
GLUCOSE, UA: NEGATIVE mg/dL
Ketones, ur: NEGATIVE mg/dL
Nitrite: NEGATIVE
PH: 5.5 (ref 5.0–8.0)
Specific Gravity, Urine: <1.005 — ABNORMAL LOW (ref 1.005–1.030)
Urobilinogen, UA: 0.2 mg/dL (ref 0.0–1.0)

## 2014-09-29 MED ORDER — SULFAMETHOXAZOLE-TRIMETHOPRIM 800-160 MG PO TABS: 1.0000 | ORAL_TABLET | Freq: Two times a day (BID) | ORAL | Status: DC

## 2014-09-29 NOTE — Progress Notes (Signed)
[name] [birth year] [id number]        59 y.o.  G1P0010 with 1-2 days of classic UTI symptoms to include mild dysuria, frequency now blood in her urine this morning. No low back pain, fevers or chills.  No vaginal discharge itching or odor.  Past medical history,surgical history, problem list, medications, allergies, family history and social history were all reviewed and documented in the EPIC chart.  Directed ROS with pertinent positives and negatives documented in the history of present illness/assessment and plan.  Exam: Filed Vitals:   09/29/14 1021  BP: 130/80   General appearance:  Normal Spine straight without CVA tenderness. Abdomen soft nontender without masses guarding rebound  Urinalysis with TNTC WBC, TNTC RBC, many bacteria  Assessment/Plan:  59 y.o. G1P0010 with classic early UTI per symptoms and urinalysis. Will treat with Septra DS 1 by mouth twice a day 3 days. Follow up if symptoms persist, worsen or recur.    [name], 10:39 AM 09/29/2014

## 2014-09-29 NOTE — Patient Instructions (Signed)
Take antibiotic twice daily for three days.  Follow up if symptoms persists, worsens or recurs  Urinary Tract Infection Urinary tract infections (UTIs) can develop anywhere along your urinary tract. Your urinary tract is your body's drainage system for removing wastes and extra water. Your urinary tract includes two kidneys, two ureters, a bladder, and a urethra. Your kidneys are a pair of bean-shaped organs. Each kidney is about the size of your fist. They are located below your ribs, one on each side of your spine. CAUSES Infections are caused by microbes, which are microscopic organisms, including fungi, viruses, and bacteria. These organisms are so small that they can only be seen through a microscope. Bacteria are the microbes that most commonly cause UTIs. SYMPTOMS  Symptoms of UTIs may vary by age and gender of the patient and by the location of the infection. Symptoms in young women typically include a frequent and intense urge to urinate and a painful, burning feeling in the bladder or urethra during urination. Older women and men are more likely to be tired, shaky, and weak and have muscle aches and abdominal pain. A fever may mean the infection is in your kidneys. Other symptoms of a kidney infection include pain in your back or sides below the ribs, nausea, and vomiting. DIAGNOSIS To diagnose a UTI, your caregiver will ask you about your symptoms. Your caregiver also will ask to provide a urine sample. The urine sample will be tested for bacteria and white blood cells. White blood cells are made by your body to help fight infection. TREATMENT  Typically, UTIs can be treated with medication. Because most UTIs are caused by a bacterial infection, they usually can be treated with the use of antibiotics. The choice of antibiotic and length of treatment depend on your symptoms and the type of bacteria causing your infection. HOME CARE INSTRUCTIONS  If you were prescribed antibiotics, take them  exactly as your caregiver instructs you. Finish the medication even if you feel better after you have only taken some of the medication.  Drink enough water and fluids to keep your urine clear or pale yellow.  Avoid caffeine, tea, and carbonated beverages. They tend to irritate your bladder.  Empty your bladder often. Avoid holding urine for long periods of time.  Empty your bladder before and after sexual intercourse.  After a bowel movement, women should cleanse from front to back. Use each tissue only once. SEEK MEDICAL CARE IF:   You have back pain.  You develop a fever.  Your symptoms do not begin to resolve within 3 days. SEEK IMMEDIATE MEDICAL CARE IF:   You have severe back pain or lower abdominal pain.  You develop chills.  You have nausea or vomiting.  You have continued burning or discomfort with urination. MAKE SURE YOU:   Understand these instructions.  Will watch your condition.  Will get help right away if you are not doing well or get worse. Document Released: 01/24/2005 Document Revised: 10/16/2011 Document Reviewed: 05/25/2011 [facility] Patient Information 2015 [place], [place]. This information is not intended to replace advice given to you by your health care provider. Make sure you discuss any questions you have with your health care provider.

## 2014-10-01 ENCOUNTER — Other Ambulatory Visit: Payer: Self-pay | Admitting: *Deleted

## 2014-10-01 DIAGNOSIS — R319 Hematuria, unspecified: Secondary | ICD-10-CM

## 2014-10-01 LAB — URINE CULTURE
Colony Count: NO GROWTH
Organism ID, Bacteria: NO GROWTH

## 2014-10-04 ENCOUNTER — Other Ambulatory Visit: Payer: 59

## 2014-10-04 DIAGNOSIS — R319 Hematuria, unspecified: Secondary | ICD-10-CM

## 2014-10-05 LAB — URINALYSIS W MICROSCOPIC + REFLEX CULTURE
BACTERIA UA: NONE SEEN
Bilirubin Urine: NEGATIVE
CRYSTALS: NONE SEEN
Casts: NONE SEEN
Glucose, UA: NEGATIVE mg/dL
Hgb urine dipstick: NEGATIVE
Ketones, ur: NEGATIVE mg/dL
Leukocytes, UA: NEGATIVE
Nitrite: NEGATIVE
Protein, ur: NEGATIVE mg/dL
Specific Gravity, Urine: 1.006 (ref 1.005–1.030)
Squamous Epithelial / LPF: NONE SEEN
Urobilinogen, UA: 0.2 mg/dL (ref 0.0–1.0)
pH: 5.5 (ref 5.0–8.0)

## 2014-10-15 ENCOUNTER — Encounter: Payer: Self-pay | Admitting: Gastroenterology

## 2014-10-25 ENCOUNTER — Other Ambulatory Visit: Payer: Self-pay

## 2015-03-04 ENCOUNTER — Ambulatory Visit (INDEPENDENT_AMBULATORY_CARE_PROVIDER_SITE_OTHER): Payer: Commercial Managed Care - HMO | Admitting: Gynecology

## 2015-03-04 ENCOUNTER — Encounter: Payer: Self-pay | Admitting: Gynecology

## 2015-03-04 VITALS — BP 140/88 | Ht 64.5 in | Wt 204.0 lb

## 2015-03-04 DIAGNOSIS — M8580 Other specified disorders of bone density and structure, unspecified site: Secondary | ICD-10-CM

## 2015-03-04 DIAGNOSIS — Z79890 Hormone replacement therapy (postmenopausal): Secondary | ICD-10-CM | POA: Diagnosis not present

## 2015-03-04 DIAGNOSIS — Z01419 Encounter for gynecological examination (general) (routine) without abnormal findings: Secondary | ICD-10-CM

## 2015-03-04 MED ORDER — ESTRADIOL 0.0375 MG/24HR TD PTTW: MEDICATED_PATCH | TRANSDERMAL | Status: DC

## 2015-03-04 NOTE — Progress Notes (Signed)
[name] 1955-06-06 [id number]        59 y.o.  G1P0010  No LMP recorded. Patient has had a hysterectomy. for annual exam.  Several issues noted below.  Past medical history,surgical history, problem list, medications, allergies, family history and social history were all reviewed and documented as reviewed in the EPIC chart.  ROS:  Performed with pertinent positives and negatives included in the history, assessment and plan.   Additional significant findings :  none   Exam: Kim Counsellor Vitals:   03/04/15 0930  BP: 140/88  Height: 5' 4.5" (1.638 m)  Weight: 204 lb (92.534 kg)   General appearance:  Normal affect, orientation and appearance. Skin: Grossly normal HEENT: Without gross lesions.  No cervical or supraclavicular adenopathy. Thyroid normal.  Lungs:  Clear without wheezing, rales or rhonchi Cardiac: RR, without RMG Abdominal:  Soft, nontender, without masses, guarding, rebound, organomegaly or hernia Breasts:  Examined lying and sitting without masses, retractions, discharge or axillary adenopathy. Pelvic:  Ext/BUS/vagina with atrophic changes.  Adnexa  Without masses or tenderness    Anus and perineum  Normal   Rectovaginal  Normal sphincter tone without palpated masses or tenderness.    Assessment/Plan:  59 y.o. G27P0010 female for annual exam.   1. Postmenopausal/HRT. Status post [facility] 2004. On Vivelle 0.0375. Doing well and wants to continue. I again reviewed the issues of HRT and WHI study with increased risk of stroke heart attack DVT and possible breast cancer. Patient comfortable continuing for now and I refilled her 1 year. 2. Osteopenia. DEXA 2014 T score -1.2 FRAX 6%/0.4%. Given she is on HRT and minimal changes we'll plan repeat in several years. Increased calcium vitamin D reviewed. 3. Mammography scheduled and she knows to follow up for this. SBE monthly reviewed. 4. Colonoscopy 2010 with planned reported repeat interval 10 years. 5. Pap smear 2015.  No Pap smear done today. No history of significant abnormal Pap smears. Options to stop screening as she is status post hysterectomy for benign indications versus less frequent screening intervals reviewed. Will readdress on an annual basis. 6. Health maintenance. Mild elevated blood pressure 140/88 reviewed with patient. Is being treated for hypertension. Reports elevations at doctor's office routine for her.  No routine lab work done as patient reports it done at her primary physician's office. Follow up 1 year, sooner as needed.   [name] MD, 9:51 AM 03/04/2015

## 2015-03-04 NOTE — Patient Instructions (Signed)

## 2015-03-08 ENCOUNTER — Encounter: Payer: Self-pay | Admitting: Gynecology

## 2015-07-11 ENCOUNTER — Other Ambulatory Visit: Payer: Self-pay | Admitting: *Deleted

## 2015-07-11 MED ORDER — ESTRADIOL 0.0375 MG/24HR TD PTTW
MEDICATED_PATCH | TRANSDERMAL | Status: DC
Start: 2015-07-11 — End: 2016-03-09

## 2016-03-09 ENCOUNTER — Encounter: Payer: Self-pay | Admitting: Gynecology

## 2016-03-09 ENCOUNTER — Ambulatory Visit (INDEPENDENT_AMBULATORY_CARE_PROVIDER_SITE_OTHER): Payer: Commercial Managed Care - HMO | Admitting: Gynecology

## 2016-03-09 VITALS — BP 124/80 | Ht 65.0 in | Wt 198.0 lb

## 2016-03-09 DIAGNOSIS — Z79890 Hormone replacement therapy (postmenopausal): Secondary | ICD-10-CM

## 2016-03-09 DIAGNOSIS — Z01419 Encounter for gynecological examination (general) (routine) without abnormal findings: Secondary | ICD-10-CM

## 2016-03-09 DIAGNOSIS — N952 Postmenopausal atrophic vaginitis: Secondary | ICD-10-CM | POA: Diagnosis not present

## 2016-03-09 MED ORDER — ESTRADIOL 0.0375 MG/24HR TD PTTW: MEDICATED_PATCH | TRANSDERMAL | 4 refills | Status: DC

## 2016-03-09 NOTE — Progress Notes (Signed)
    [name] 1955-08-18 [id number]        60 y.o.  G1P0010  for annual exam.  Several issues noted below.  Past medical history,surgical history, problem list, medications, allergies, family history and social history were all reviewed and documented as reviewed in the EPIC chart.  ROS:  Performed with pertinent positives and negatives included in the history, assessment and plan.   Additional significant findings :  None   Exam: Caryn Bee assistant Vitals:   03/09/16 0953  BP: 124/80  Weight: 198 lb (89.8 kg)  Height: 5\' 5"  (1.651 m)   Body mass index is 32.95 kg/m.  General appearance:  Normal affect, orientation and appearance. Skin: Grossly normal HEENT: Without gross lesions.  No cervical or supraclavicular adenopathy. Thyroid normal.  Lungs:  Clear without wheezing, rales or rhonchi Cardiac: RR, without RMG Abdominal:  Soft, nontender, without masses, guarding, rebound, organomegaly or hernia Breasts:  Examined lying and sitting without masses, retractions, discharge or axillary adenopathy. Pelvic:  Ext, BUS, Vagina with atrophic changes  Adnexa without masses or tenderness    Anus and perineum normal   Rectovaginal normal sphincter tone without palpated masses or tenderness.    Assessment/Plan:  60 y.o. G73P0010 female for annual exam.   1. Status post [facility] 2004. Continues on Vivelle 0.0375 patches.  Has tried stopping with unacceptable hot flushes and mood swings. I reviewed the most current 2017 NAMS guidelines on HRT. I reviewed the habits to include symptom relief possible cardiovascular/bone health with early initiation versus risks to include thrombosis such as stroke heart attack DVT and breast cancer issues. Has a shared risk versus benefit decision the patient wants to continue understanding clearly the issues and risks and I refilled her 1 year. 2. Osteopenia. DEXA 2014 T score -1.2 FRAX 6%/0.4%. We discussed the minimal nature of the osteopenia and the  options to repeat the bone density now versus waiting. Given that she continues on HRT which is beneficial to her bone health we both agreed to wait and repeat this in another year or 2. Increased vitamin D/calcium reviewed. 3. Pap smear 2015. No Pap smear done today. No history of significant abnormal Pap smears. Per current screening guidelines as patient is status post hysterectomy for benign indications options to stop screening reviewed. Will readdress on an annual basis. 4. Colonoscopy 2010 with reported repeat interval 10 years. 5. Mammography today. Continue with annual mammography when due next year. SBE monthly reviewed. 6. Health maintenance. No routine lab work done as patient does this elsewhere. Follow up in one year, sooner as needed.  Additional time in excess of her routine gynecologic exam was spent in direct face to face counseling and coordination of care in regards to her hormone replacement therapy, risks versus benefits discussion, review of the latest guidelines as well as discussion of her osteopenia and screening option reviewed.[name] MD, 10:17 AM 03/09/2016

## 2016-03-09 NOTE — Patient Instructions (Signed)

## 2016-05-02 DIAGNOSIS — E1129 Type 2 diabetes mellitus with other diabetic kidney complication: Secondary | ICD-10-CM | POA: Diagnosis not present

## 2016-05-02 DIAGNOSIS — N182 Chronic kidney disease, stage 2 (mild): Secondary | ICD-10-CM | POA: Diagnosis not present

## 2016-05-02 DIAGNOSIS — Z1389 Encounter for screening for other disorder: Secondary | ICD-10-CM | POA: Diagnosis not present

## 2016-05-02 DIAGNOSIS — I129 Hypertensive chronic kidney disease with stage 1 through stage 4 chronic kidney disease, or unspecified chronic kidney disease: Secondary | ICD-10-CM | POA: Diagnosis not present

## 2016-07-23 DIAGNOSIS — H40023 Open angle with borderline findings, high risk, bilateral: Secondary | ICD-10-CM | POA: Diagnosis not present

## 2016-07-23 DIAGNOSIS — H04123 Dry eye syndrome of bilateral lacrimal glands: Secondary | ICD-10-CM | POA: Diagnosis not present

## 2016-11-01 DIAGNOSIS — Z0000 Encounter for general adult medical examination without abnormal findings: Secondary | ICD-10-CM | POA: Diagnosis not present

## 2016-11-08 DIAGNOSIS — Z1389 Encounter for screening for other disorder: Secondary | ICD-10-CM | POA: Diagnosis not present

## 2016-11-08 DIAGNOSIS — N182 Chronic kidney disease, stage 2 (mild): Secondary | ICD-10-CM | POA: Diagnosis not present

## 2016-11-08 DIAGNOSIS — E1129 Type 2 diabetes mellitus with other diabetic kidney complication: Secondary | ICD-10-CM | POA: Diagnosis not present

## 2016-11-08 DIAGNOSIS — I129 Hypertensive chronic kidney disease with stage 1 through stage 4 chronic kidney disease, or unspecified chronic kidney disease: Secondary | ICD-10-CM | POA: Diagnosis not present

## 2016-11-08 DIAGNOSIS — Z0000 Encounter for general adult medical examination without abnormal findings: Secondary | ICD-10-CM | POA: Diagnosis not present

## 2016-11-09 DIAGNOSIS — Z1212 Encounter for screening for malignant neoplasm of rectum: Secondary | ICD-10-CM | POA: Diagnosis not present

## 2016-11-26 DIAGNOSIS — H43812 Vitreous degeneration, left eye: Secondary | ICD-10-CM | POA: Diagnosis not present

## 2016-11-26 DIAGNOSIS — H04123 Dry eye syndrome of bilateral lacrimal glands: Secondary | ICD-10-CM | POA: Diagnosis not present

## 2016-11-26 DIAGNOSIS — H40023 Open angle with borderline findings, high risk, bilateral: Secondary | ICD-10-CM | POA: Diagnosis not present

## 2017-01-18 ENCOUNTER — Telehealth: Payer: Self-pay | Admitting: *Deleted

## 2017-01-18 MED ORDER — ESTRADIOL 0.0375 MG/24HR TD PTTW: MEDICATED_PATCH | TRANSDERMAL | 0 refills | Status: DC

## 2017-01-18 NOTE — Telephone Encounter (Signed)
Patient has annual exam scheduled on 03/13/17 need refill on estradiol patch 0.0375 mg. Rx sent.

## 2017-02-09 DIAGNOSIS — Z23 Encounter for immunization: Secondary | ICD-10-CM | POA: Diagnosis not present

## 2017-03-13 ENCOUNTER — Ambulatory Visit (INDEPENDENT_AMBULATORY_CARE_PROVIDER_SITE_OTHER): Payer: Commercial Managed Care - HMO | Admitting: Gynecology

## 2017-03-13 ENCOUNTER — Encounter: Payer: Commercial Managed Care - HMO | Admitting: Gynecology

## 2017-03-13 ENCOUNTER — Encounter: Payer: Self-pay | Admitting: Gynecology

## 2017-03-13 VITALS — BP 140/88 | Ht 64.5 in | Wt 200.0 lb

## 2017-03-13 DIAGNOSIS — N952 Postmenopausal atrophic vaginitis: Secondary | ICD-10-CM

## 2017-03-13 DIAGNOSIS — M8580 Other specified disorders of bone density and structure, unspecified site: Secondary | ICD-10-CM

## 2017-03-13 DIAGNOSIS — Z79890 Hormone replacement therapy (postmenopausal): Secondary | ICD-10-CM

## 2017-03-13 DIAGNOSIS — Z01411 Encounter for gynecological examination (general) (routine) with abnormal findings: Secondary | ICD-10-CM

## 2017-03-13 DIAGNOSIS — Z1231 Encounter for screening mammogram for malignant neoplasm of breast: Secondary | ICD-10-CM | POA: Diagnosis not present

## 2017-03-13 MED ORDER — ESTRADIOL 0.0375 MG/24HR TD PTTW: MEDICATED_PATCH | TRANSDERMAL | 12 refills | Status: DC

## 2017-03-13 NOTE — Addendum Note (Signed)
Addended by: Nelva Nay on: 03/13/2017 12:53 PM   Modules accepted: Orders

## 2017-03-13 NOTE — Progress Notes (Signed)
    [name] 01-13-1956 [id number]        61 y.o.  G1P0010 for annual gynecologic exam.  Doing well.  Past medical history,surgical history, problem list, medications, allergies, family history and social history were all reviewed and documented as reviewed in the EPIC chart.  ROS:  Performed with pertinent positives and negatives included in the history, assessment and plan.   Additional significant findings : None   Exam: Caryn Bee assistant Vitals:   03/13/17 1220  BP: 140/88  Weight: 200 lb (90.7 kg)  Height: 5' 4.5" (1.638 m)   Body mass index is 33.8 kg/m.  General appearance:  Normal affect, orientation and appearance. Skin: Grossly normal HEENT: Without gross lesions.  No cervical or supraclavicular adenopathy. Thyroid normal.  Lungs:  Clear without wheezing, rales or rhonchi Cardiac: RR, without RMG Abdominal:  Soft, nontender, without masses, guarding, rebound, organomegaly or hernia Breasts:  Examined lying and sitting without masses, retractions, discharge or axillary adenopathy. Pelvic:  Ext, BUS, Vagina: With atrophic changes.  Pap smear of cuff done  Adnexa: Without masses or tenderness    Anus and perineum: Normal   Rectovaginal: Normal sphincter tone without palpated masses or tenderness.    Assessment/Plan:  61 y.o. G28P0010 female for annual gynecologic exam.   1. Postmenopausal.  Status post [facility] 2004.  On Vivelle 0.0375 patches.  Doing well with this and wants to continue.  Had tried stopping previously and had unacceptable hot flushes and sweats.  I again reviewed the whole issue of HRT with risks versus benefits.  Thrombosis such as stroke heart attack DVT in the breast cancer issue reviewed.  Benefits as far as symptom relief cardiovascular and bone health reviewed.  At this point the patient wants to continue and I refilled her times 1 year. 2. Osteopenia.  DEXA 2014 T score -1.2 FRAX 6% / 0.4%.  Reviewed whether to repeat now or wait 1 more year  and she prefers waiting.  Will plan DEXA next year at 5-year interval. 3. Mammography today.  Breast exam normal today.  SBE monthly reviewed. 4. Colonoscopy 2010.  Repeat at their recommended interval. 5. Pap smear 2015.  Pap smear done today.  No history of significant abnormal Pap smears.  Options to stop screening per current screening guidelines reviewed.  Will readdress on an annual basis. 6. Health maintenance.  Blood pressure 140/88.  Does have a history of hypertension.  Will monitor and follow-up with her primary physician if remains elevated.  No routine lab work done as patient does this elsewhere.  Follow-up 1 year, sooner as needed.   [name] MD, 12:47 PM 03/13/2017

## 2017-03-13 NOTE — Patient Instructions (Signed)
Follow-up in 1 year for annual exam, sooner if any issues. 

## 2017-03-15 LAB — PAP IG W/ RFLX HPV ASCU

## 2017-05-08 DIAGNOSIS — E1129 Type 2 diabetes mellitus with other diabetic kidney complication: Secondary | ICD-10-CM | POA: Diagnosis not present

## 2017-05-08 DIAGNOSIS — I129 Hypertensive chronic kidney disease with stage 1 through stage 4 chronic kidney disease, or unspecified chronic kidney disease: Secondary | ICD-10-CM | POA: Diagnosis not present

## 2017-05-08 DIAGNOSIS — N182 Chronic kidney disease, stage 2 (mild): Secondary | ICD-10-CM | POA: Diagnosis not present

## 2017-05-31 DIAGNOSIS — H04123 Dry eye syndrome of bilateral lacrimal glands: Secondary | ICD-10-CM | POA: Diagnosis not present

## 2017-05-31 DIAGNOSIS — H43812 Vitreous degeneration, left eye: Secondary | ICD-10-CM | POA: Diagnosis not present

## 2017-05-31 DIAGNOSIS — H40023 Open angle with borderline findings, high risk, bilateral: Secondary | ICD-10-CM | POA: Diagnosis not present

## 2017-09-30 DIAGNOSIS — Z23 Encounter for immunization: Secondary | ICD-10-CM | POA: Diagnosis not present

## 2017-11-01 DIAGNOSIS — E1129 Type 2 diabetes mellitus with other diabetic kidney complication: Secondary | ICD-10-CM | POA: Diagnosis not present

## 2017-11-01 DIAGNOSIS — J069 Acute upper respiratory infection, unspecified: Secondary | ICD-10-CM | POA: Diagnosis not present

## 2017-11-01 DIAGNOSIS — R05 Cough: Secondary | ICD-10-CM | POA: Diagnosis not present

## 2017-11-06 DIAGNOSIS — E7800 Pure hypercholesterolemia, unspecified: Secondary | ICD-10-CM | POA: Diagnosis not present

## 2017-11-06 DIAGNOSIS — I10 Essential (primary) hypertension: Secondary | ICD-10-CM | POA: Diagnosis not present

## 2017-11-06 DIAGNOSIS — E1129 Type 2 diabetes mellitus with other diabetic kidney complication: Secondary | ICD-10-CM | POA: Diagnosis not present

## 2017-11-06 DIAGNOSIS — N182 Chronic kidney disease, stage 2 (mild): Secondary | ICD-10-CM | POA: Diagnosis not present

## 2017-11-06 DIAGNOSIS — R82998 Other abnormal findings in urine: Secondary | ICD-10-CM | POA: Diagnosis not present

## 2017-11-13 DIAGNOSIS — I129 Hypertensive chronic kidney disease with stage 1 through stage 4 chronic kidney disease, or unspecified chronic kidney disease: Secondary | ICD-10-CM | POA: Diagnosis not present

## 2017-11-13 DIAGNOSIS — Z1389 Encounter for screening for other disorder: Secondary | ICD-10-CM | POA: Diagnosis not present

## 2017-11-13 DIAGNOSIS — N182 Chronic kidney disease, stage 2 (mild): Secondary | ICD-10-CM | POA: Diagnosis not present

## 2017-11-13 DIAGNOSIS — Z23 Encounter for immunization: Secondary | ICD-10-CM | POA: Diagnosis not present

## 2017-11-13 DIAGNOSIS — E1129 Type 2 diabetes mellitus with other diabetic kidney complication: Secondary | ICD-10-CM | POA: Diagnosis not present

## 2017-11-13 DIAGNOSIS — Z0000 Encounter for general adult medical examination without abnormal findings: Secondary | ICD-10-CM | POA: Diagnosis not present

## 2017-11-22 DIAGNOSIS — H43812 Vitreous degeneration, left eye: Secondary | ICD-10-CM | POA: Diagnosis not present

## 2017-11-22 DIAGNOSIS — H04123 Dry eye syndrome of bilateral lacrimal glands: Secondary | ICD-10-CM | POA: Diagnosis not present

## 2017-11-22 DIAGNOSIS — H40023 Open angle with borderline findings, high risk, bilateral: Secondary | ICD-10-CM | POA: Diagnosis not present

## 2018-01-11 DIAGNOSIS — Z23 Encounter for immunization: Secondary | ICD-10-CM | POA: Diagnosis not present

## 2018-02-18 DIAGNOSIS — Z23 Encounter for immunization: Secondary | ICD-10-CM | POA: Diagnosis not present

## 2018-02-19 DIAGNOSIS — I10 Essential (primary) hypertension: Secondary | ICD-10-CM | POA: Diagnosis not present

## 2018-02-19 DIAGNOSIS — N182 Chronic kidney disease, stage 2 (mild): Secondary | ICD-10-CM | POA: Diagnosis not present

## 2018-02-19 DIAGNOSIS — I129 Hypertensive chronic kidney disease with stage 1 through stage 4 chronic kidney disease, or unspecified chronic kidney disease: Secondary | ICD-10-CM | POA: Diagnosis not present

## 2018-03-17 ENCOUNTER — Encounter: Payer: Self-pay | Admitting: Gynecology

## 2018-03-17 ENCOUNTER — Ambulatory Visit (INDEPENDENT_AMBULATORY_CARE_PROVIDER_SITE_OTHER): Payer: 59 | Admitting: Gynecology

## 2018-03-17 VITALS — BP 124/80 | Ht 65.0 in | Wt 204.0 lb

## 2018-03-17 DIAGNOSIS — Z1231 Encounter for screening mammogram for malignant neoplasm of breast: Secondary | ICD-10-CM | POA: Diagnosis not present

## 2018-03-17 DIAGNOSIS — Z01419 Encounter for gynecological examination (general) (routine) without abnormal findings: Secondary | ICD-10-CM | POA: Diagnosis not present

## 2018-03-17 DIAGNOSIS — M8580 Other specified disorders of bone density and structure, unspecified site: Secondary | ICD-10-CM | POA: Diagnosis not present

## 2018-03-17 DIAGNOSIS — N952 Postmenopausal atrophic vaginitis: Secondary | ICD-10-CM

## 2018-03-17 DIAGNOSIS — Z79890 Hormone replacement therapy (postmenopausal): Secondary | ICD-10-CM | POA: Diagnosis not present

## 2018-03-17 MED ORDER — ESTRADIOL 0.0375 MG/24HR TD PTTW: MEDICATED_PATCH | TRANSDERMAL | 12 refills | Status: DC

## 2018-03-17 NOTE — Patient Instructions (Signed)
Follow-up for the bone density as scheduled. 

## 2018-03-17 NOTE — Progress Notes (Signed)
    CHARENE MCCALLISTER 28-Dec-1955 [id number]        62 y.o.  G1P0010 for annual gynecologic exam.  Continues on HRT doing well.  Past medical history,surgical history, problem list, medications, allergies, family history and social history were all reviewed and documented as reviewed in the EPIC chart.  ROS:  Performed with pertinent positives and negatives included in the history, assessment and plan.   Additional significant findings : None   Exam: Caryn Bee assistant Vitals:   03/17/18 0939  BP: 124/80  Weight: 204 lb (92.5 kg)  Height: 5\' 5"  (1.651 m)   Body mass index is 33.95 kg/m.  General appearance:  Normal affect, orientation and appearance. Skin: Grossly normal HEENT: Without gross lesions.  No cervical or supraclavicular adenopathy. Thyroid normal.  Lungs:  Clear without wheezing, rales or rhonchi Cardiac: RR, without RMG Abdominal:  Soft, nontender, without masses, guarding, rebound, organomegaly or hernia Breasts:  Examined lying and sitting without masses, retractions, discharge or axillary adenopathy. Pelvic:  Ext, BUS, Vagina: With atrophic changes  Adnexa: Without masses or tenderness    Anus and perineum: Normal   Rectovaginal: Normal sphincter tone without palpated masses or tenderness.    Assessment/Plan:  62 y.o. G20P0010 female for annual gynecologic exam.   1. Postmenopausal/ERT.  Continues on Vivelle 0.0375 patches.  Status post [facility] 2004.  We discussed again the issues of ERT, risks versus benefits.  Symptom relief as well as cardiovascular and bone health when started early versus risks to include thrombosis and possible breast cancer issues all reviewed.  At this point the patient wants to continue and I refilled her x1 year. 2. Osteopenia.  DEXA 2014 T score -1.2.  Recommend follow-up DEXA now at 5-year interval and patient agrees to schedule. 3. Mammography today.  Breast exam normal today. 4. Colonoscopy 2010.  Repeat at their recommended  interval. 5. Pap smear 2018.  No Pap smear done today.  No history of significant abnormal Pap smears.  Options to stop screening per current screening guidelines based on hysterectomy history reviewed. 6. Health maintenance.  No routine lab work done as patient does this elsewhere.  Follow-up 1 year, sooner as needed.   [name] MD, 10:04 AM 03/17/2018

## 2018-04-06 ENCOUNTER — Other Ambulatory Visit: Payer: Self-pay | Admitting: Gynecology

## 2018-04-17 ENCOUNTER — Encounter: Payer: Self-pay | Admitting: Gynecology

## 2018-04-17 ENCOUNTER — Ambulatory Visit: Payer: 59

## 2018-04-17 ENCOUNTER — Other Ambulatory Visit: Payer: Self-pay | Admitting: Gynecology

## 2018-04-17 DIAGNOSIS — M8580 Other specified disorders of bone density and structure, unspecified site: Secondary | ICD-10-CM

## 2018-04-17 DIAGNOSIS — Z780 Asymptomatic menopausal state: Secondary | ICD-10-CM

## 2018-04-17 DIAGNOSIS — M8589 Other specified disorders of bone density and structure, multiple sites: Secondary | ICD-10-CM

## 2018-05-12 DIAGNOSIS — I129 Hypertensive chronic kidney disease with stage 1 through stage 4 chronic kidney disease, or unspecified chronic kidney disease: Secondary | ICD-10-CM | POA: Diagnosis not present

## 2018-05-12 DIAGNOSIS — E7800 Pure hypercholesterolemia, unspecified: Secondary | ICD-10-CM | POA: Diagnosis not present

## 2018-05-12 DIAGNOSIS — I10 Essential (primary) hypertension: Secondary | ICD-10-CM | POA: Diagnosis not present

## 2018-05-12 DIAGNOSIS — E1129 Type 2 diabetes mellitus with other diabetic kidney complication: Secondary | ICD-10-CM | POA: Diagnosis not present

## 2018-05-12 DIAGNOSIS — N182 Chronic kidney disease, stage 2 (mild): Secondary | ICD-10-CM | POA: Diagnosis not present

## 2018-05-26 DIAGNOSIS — H40023 Open angle with borderline findings, high risk, bilateral: Secondary | ICD-10-CM | POA: Diagnosis not present

## 2018-05-30 DIAGNOSIS — H04123 Dry eye syndrome of bilateral lacrimal glands: Secondary | ICD-10-CM | POA: Diagnosis not present

## 2018-05-30 DIAGNOSIS — H40023 Open angle with borderline findings, high risk, bilateral: Secondary | ICD-10-CM | POA: Diagnosis not present

## 2018-08-28 ENCOUNTER — Telehealth: Payer: Self-pay | Admitting: Cardiology

## 2018-08-28 NOTE — Telephone Encounter (Signed)
Mychart, smartphone, pre reg complete 08/28/18 AF °

## 2018-08-28 NOTE — Progress Notes (Signed)
Virtual Visit via Video Note   This visit type was conducted due to national recommendations for restrictions regarding the COVID-19 Pandemic (e.g. social distancing) in an effort to limit this patient's exposure and mitigate transmission in our community.  Due to her co-morbid illnesses, this patient is at least at moderate risk for complications without adequate follow up.  This format is felt to be most appropriate for this patient at this time.  All issues noted in this document were discussed and addressed.  A limited physical exam was performed with this format.  Please refer to the patient's chart for her consent to telehealth for [facility].   Date:  08/29/2018   ID:  [name], DOB March 06, 1956, MRN [id number]  Patient Location: Home Provider Location: Home  PCP:  Tisovec, Fransico Him, MD  Cardiologist:  Minus Breeding, MD  Electrophysiologist:  None   Evaluation Performed:  New Patient Evaluation  Chief Complaint:  Bradycardia  History of Present Illness:    [name] is a 63 y.o. female who is referred by Tisovec, Fransico Him, MD for evaluation of bradycardia.  The patient has no past cardiac history.  She was recently treated with carvedilol for blood pressure issues.  She said that at the same time she started noticing that her heart rate went down into the 40s and she was taken off this medication.  She says however that since then he continues to consistently be in the 38s.  She is had some decreased exercise tolerance but she is not had any presyncope or syncope.  She has not had any chest pressure, neck or arm discomfort.  She has some mild dyspnea on exertion but she is not having any PND or orthopnea.  Is not having any palpitations, presyncope or syncope.    She is never had this problem before.  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  She has not had much limitation with this  although she has not done this in a while.  She does report that she had increased leg swelling and some weight gain recently and was started on low-dose diuretic.  She was not sure why this happened.  The patient does not have symptoms concerning for COVID-19 infection (fever, chills, cough, or new shortness of breath).    Past Medical History:  Diagnosis Date  . Diabetes mellitus   . Hypertension   . Osteopenia 03/2018   T score -1.1 FRAX 7% / 0.5%   Past Surgical History:  Procedure Laterality Date  . DILATION AND CURETTAGE OF UTERUS    . HYSTEROSCOPY     AND D&C/FOR ENDO POLYP  . LASIK    . TUBAL LIGATION  1999  . VAGINAL HYSTERECTOMY  2004     Current Meds  Medication Sig  . ASPIRIN LOW DOSE PO Take by mouth. "BABY ASPIRIN"   . Atorvastatin Calcium (LIPITOR PO) Take 5 mg by mouth daily.    Marland Kitchen Bioflavonoid Products (ESTER C PO) Take by mouth.    . Cetirizine HCl (ZYRTEC PO) Take by mouth.  Marland Kitchen CINNAMON PO Take by mouth.  . estradiol (VIVELLE-DOT) 0.0375 MG/24HR PLACE 1 PATCH ONTO THE SKIN2 TIMES A WEEK  . furosemide (LASIX) 20 MG tablet Take 20 mg by mouth daily.  . Linagliptin-Metformin HCl (JENTADUETO PO) Take 2.5-1,000 mg by mouth 2 (two) times a day.   . lisinopril (PRINIVIL,ZESTRIL) 40 MG  tablet Take 40 mg by mouth daily.    . Multiple Vitamin (MULTIVITAMIN) tablet Take 1 tablet by mouth daily.    Vladimir Faster Glycol-Propyl Glycol (SYSTANE ULTRA OP) Apply to eye.  . [DISCONTINUED] carvedilol (COREG) 6.25 MG tablet Take 6.25 mg by mouth 2 (two) times daily with a meal.     Allergies:   Penicillins   Social History   Tobacco Use  . Smoking status: Never Smoker  . Smokeless tobacco: Never Used  Substance Use Topics  . Alcohol use: Yes    Alcohol/week: 4.0 standard drinks    Types: 4 Standard drinks or equivalent per week  . Drug use: No     Family Hx: The patient's family history includes Diabetes in her brother, father, mother, and paternal aunt; Heart disease  (age of onset: 20) in her mother; Hypertension in her mother.  ROS:   Please see the history of present illness.    As stated in the HPI and negative for all other systems.    Prior CV studies:   The following studies were reviewed today:  None  Labs/Other Tests and Data Reviewed:    EKG:  No ECG reviewed.  Recent Labs: No results found for requested labs within last 8760 hours.   Recent Lipid Panel No results found for: CHOL, TRIG, HDL, CHOLHDL, LDLCALC, LDLDIRECT  Wt Readings from Last 3 Encounters:  08/29/18 201 lb (91.2 kg)  03/17/18 204 lb (92.5 kg)  03/13/17 200 lb (90.7 kg)     Objective:    Vital Signs:  BP (!) 189/80   Pulse (!) 43   Ht 5\' 5"  (1.651 m)   Wt 201 lb (91.2 kg)   BMI 33.45 kg/m    VITAL SIGNS:  reviewed GEN:  no acute distress RESPIRATORY:  normal respiratory effort, symmetric expansion MUSCULOSKELETAL:  no obvious deformities. NEURO:  alert and oriented x 3, no obvious focal deficit  ASSESSMENT & PLAN:    HTN:   The blood pressure is at target. No change in medications is indicated. We will continue with therapeutic lifestyle changes (TLC).   BRADYCARDIA:  I am going to see her in the office next week and check an EKG.  I will check her HR with physical activity and consider further testing based on her exam.     FATIGUE:  I will follow up with TSH and look at other labs.    COVID-19 Education: The signs and symptoms of COVID-19 were discussed with the patient and how to seek care for testing (follow up with PCP or arrange E-visit). The importance of social distancing was discussed today.  Time:   Today, I have spent 25 minutes with the patient with telehealth technology discussing the above problems.     Medication Adjustments/Labs and Tests Ordered: Current medicines are reviewed at length with the patient today.  Concerns regarding medicines are outlined above.   Tests Ordered: No orders of the defined types were placed in this  encounter.   Medication Changes: No orders of the defined types were placed in this encounter.   Disposition:  Follow up with me next week in the office.   Signed, Minus Breeding, MD  08/29/2018 5:03 PM    [facility]

## 2018-08-29 ENCOUNTER — Encounter: Payer: Self-pay | Admitting: Cardiology

## 2018-08-29 ENCOUNTER — Telehealth (INDEPENDENT_AMBULATORY_CARE_PROVIDER_SITE_OTHER): Payer: 59 | Admitting: Cardiology

## 2018-08-29 VITALS — BP 189/80 | HR 43 | Ht 65.0 in | Wt 201.0 lb

## 2018-08-29 DIAGNOSIS — R001 Bradycardia, unspecified: Secondary | ICD-10-CM | POA: Diagnosis not present

## 2018-08-29 DIAGNOSIS — R5383 Other fatigue: Secondary | ICD-10-CM | POA: Insufficient documentation

## 2018-08-29 NOTE — Patient Instructions (Addendum)
Medication Instructions:  Continue current medications  If you need a refill on your cardiac medications before your next appointment, please call your pharmacy.  Labwork: None Ordered    Testing/Procedures: None Ordered   Follow-Up: . Your physician recommends that you schedule a follow-up appointment in: May 7th @ 1:20   At [facility], you and your health needs are our priority.  As part of our continuing mission to provide you with exceptional heart care, we have created designated Provider Care Teams.  These Care Teams include your primary Cardiologist (physician) and Advanced Practice Providers (APPs -  Physician Assistants and Nurse Practitioners) who all work together to provide you with the care you need, when you need it.  Thank you for choosing [facility] at [facility]!!

## 2018-09-03 NOTE — Progress Notes (Addendum)
Cardiology Office Note   Date:  09/04/2018   ID:  [name], DOB 09-27-1955, MRN [id number]  PCP:  Haywood Pao, MD  Cardiologist:   Minus Breeding, MD   No chief complaint on file.     History of Present Illness: [name] is a 63 y.o. female who presents for follow up of bradycardia   She had started carvedilol for blood pressure issues.  She said that at the same time she started noticing that her heart rate went down into the 40s and she was taken off this medication.  She says however that since then he continues to consistently be in the 47s.   She is had some decreased exercise tolerance but she is not had any presyncope or syncope.    She was having increased leg swelling.    I brought her back to day and she continues to have the same symptoms.  She has decreased exercise tolerance.  She did have leg edema which resolved with diuretic.  She is not having any chest pain, neck or arm pain.    Past Medical History:  Diagnosis Date   Diabetes mellitus    Hypertension    Osteopenia 03/2018   T score -1.1 FRAX 7% / 0.5%    Past Surgical History:  Procedure Laterality Date   DILATION AND CURETTAGE OF UTERUS     HYSTEROSCOPY     AND D&C/FOR ENDO POLYP   LASIK     TUBAL LIGATION  1999   VAGINAL HYSTERECTOMY  2004     Current Outpatient Medications  Medication Sig Dispense Refill   ASPIRIN LOW DOSE PO Take by mouth. "BABY ASPIRIN"      Atorvastatin Calcium (LIPITOR PO) Take 5 mg by mouth daily.       Bioflavonoid Products (ESTER C PO) Take by mouth.       Cetirizine HCl (ZYRTEC PO) Take by mouth.     CINNAMON PO Take by mouth.     estradiol (VIVELLE-DOT) 0.0375 MG/24HR PLACE 1 PATCH ONTO THE SKIN2 TIMES A WEEK 24 patch 4   furosemide (LASIX) 20 MG tablet Take 20 mg by mouth daily.     Linagliptin-Metformin HCl (JENTADUETO PO) Take 2.5-1,000 mg by mouth 2 (two) times a day.      lisinopril (PRINIVIL,ZESTRIL) 40 MG tablet Take 40 mg by  mouth daily.       Multiple Vitamin (MULTIVITAMIN) tablet Take 1 tablet by mouth daily.       Polyethyl Glycol-Propyl Glycol (SYSTANE ULTRA OP) Apply to eye.     No current facility-administered medications for this visit.     Allergies:   Penicillins    Social History:  The patient  reports that she has never smoked. She has never used smokeless tobacco. She reports current alcohol use of about 4.0 standard drinks of alcohol per week. She reports that she does not use drugs.   Family History:  The patient's family history includes Diabetes in her brother, father, mother, and paternal aunt; Heart disease (age of onset: 31) in her mother; Hypertension in her mother.    ROS:  Please see the history of present illness.   Otherwise, review of systems are positive for none.   All other systems are reviewed and negative.    PHYSICAL EXAM: VS:  BP (!) 152/80    Pulse (!) 43    Ht 5\' 5"  (1.651 m)    Wt 205 lb (93 kg)  BMI 34.11 kg/m  , BMI Body mass index is 34.11 kg/m. GENERAL:  Well appearing HEENT:  Pupils equal round and reactive, fundi not visualized, oral mucosa unremarkable NECK:  No jugular venous distention, waveform within normal limits, carotid upstroke brisk and symmetric, no bruits, no thyromegaly LYMPHATICS:  No cervical, inguinal adenopathy LUNGS:  Clear to auscultation bilaterally BACK:  No CVA tenderness CHEST:  Unremarkable HEART:  PMI not displaced or sustained,S1 and S2 within normal limits, no S3, no S4, no clicks, no rubs, no murmurs ABD:  Flat, positive bowel sounds normal in frequency in pitch, no bruits, no rebound, no guarding, no midline pulsatile mass, no hepatomegaly, no splenomegaly EXT:  2 plus pulses throughout, no edema, no cyanosis no clubbing SKIN:  No rashes no nodules NEURO:  Cranial nerves II through XII grossly intact, motor grossly intact throughout PSYCH:  Cognitively intact, oriented to person place and time   EKG:  EKG is ordered today. The  ekg ordered today demonstrates sinus rhythm rate 100 complete heart block, junctional escape, no acute ST-T wave changes.   Recent Labs: No results found for requested labs within last 8760 hours.    Lipid Panel No results found for: CHOL, TRIG, HDL, CHOLHDL, VLDL, LDLCALC, LDLDIRECT    Wt Readings from Last 3 Encounters:  09/04/18 205 lb (93 kg)  08/29/18 201 lb (91.2 kg)  03/17/18 204 lb (92.5 kg)      Other studies Reviewed: Additional studies/ records that were reviewed today include: None. Review of the above records demonstrates:  Please see elsewhere in the note.     ASSESSMENT AND PLAN:  HTN:    For now I am going to keep an eye on her BP.  I will probably adjust meds post procedure as below.  CHB: She has complete heart block with no medications inducing this.  Check labs to include a TSH.  However, it appears that she needs a pacemaker and I will set her up to have this done for the treatment of complete heart block.  Of note I did walk her around the office very quickly and she had a heart rate of to 61.  We did an EKG with this.  It was still complete heart block but with some ventricular ectopy in a bigeminal pattern.  After this she was in normal sinus rhythm but with profound first-degree AV block and a heart rate in the 40s.  FATIGUE:   This can be explained by complete heart block.  I will check CBC, BMP, TSH as above.  EDEMA: I will check an echocardiogram when she presents for pacemaker.   Current medicines are reviewed at length with the patient today.  The patient does not have concerns regarding medicines.  The following changes have been made:  no change  Labs/ tests ordered today include:  No orders of the defined types were placed in this encounter.    Disposition:   FU with me after the pacemaker.     Signed, Minus Breeding, MD  09/04/2018 3:46 PM    [facility]

## 2018-09-04 ENCOUNTER — Telehealth: Payer: Self-pay | Admitting: *Deleted

## 2018-09-04 ENCOUNTER — Ambulatory Visit (INDEPENDENT_AMBULATORY_CARE_PROVIDER_SITE_OTHER): Payer: 59 | Admitting: Cardiology

## 2018-09-04 ENCOUNTER — Encounter: Payer: Self-pay | Admitting: Cardiology

## 2018-09-04 ENCOUNTER — Other Ambulatory Visit: Payer: Self-pay

## 2018-09-04 VITALS — BP 152/80 | HR 43 | Ht 65.0 in | Wt 205.0 lb

## 2018-09-04 DIAGNOSIS — I442 Atrioventricular block, complete: Secondary | ICD-10-CM

## 2018-09-04 DIAGNOSIS — R609 Edema, unspecified: Secondary | ICD-10-CM

## 2018-09-04 NOTE — Telephone Encounter (Signed)
PPM implant scheduled for Monday 5/11. Procedure instructions reviewed with pt. NPO after MN night before procedure. Labs to be performed morning of procedure. Hold morning medications day of procedure. Arrive to [facility] @ 5:30 am. Surgical scrub given to pt today while in office,  instructions reviewed w/ pt Pt understands she must have someone to stay with her post procedure overnight. Reminded of quarantine order post COVID screening tomorrow morning. Aware office will call to arrange post PPM follow up. Patient verbalized understanding and agreeable to plan.

## 2018-09-04 NOTE — Addendum Note (Signed)
Addended by: Vennie Homans on: 09/04/2018 03:58 PM   Modules accepted: Orders

## 2018-09-05 ENCOUNTER — Other Ambulatory Visit (HOSPITAL_COMMUNITY)
Admission: RE | Admit: 2018-09-05 | Discharge: 2018-09-05 | Disposition: A | Discharge status: Home / Self Care | Payer: 59 | Source: Ambulatory Visit | Attending: Cardiology | Admitting: Cardiology

## 2018-09-05 DIAGNOSIS — Z1159 Encounter for screening for other viral diseases: Secondary | ICD-10-CM | POA: Insufficient documentation

## 2018-09-05 NOTE — Addendum Note (Signed)
Addended by: Meryl Crutch on: 09/05/2018 01:31 PM   Modules accepted: Orders

## 2018-09-06 LAB — NOVEL CORONAVIRUS, NAA (HOSP ORDER, SEND-OUT TO REF LAB; TAT 18-24 HRS): SARS-CoV-2, NAA: NOT DETECTED

## 2018-09-08 ENCOUNTER — Ambulatory Visit (HOSPITAL_COMMUNITY)
Admission: RE | Disposition: A | Discharge status: Home / Self Care | Payer: Self-pay | Source: Home / Self Care | Attending: Cardiology

## 2018-09-08 ENCOUNTER — Other Ambulatory Visit: Payer: Self-pay

## 2018-09-08 ENCOUNTER — Ambulatory Visit (HOSPITAL_BASED_OUTPATIENT_CLINIC_OR_DEPARTMENT_OTHER)
Admission: RE | Admit: 2018-09-08 | Discharge: 2018-09-08 | Disposition: A | Discharge status: Home / Self Care | Payer: 59 | Source: Home / Self Care | Attending: Cardiology | Admitting: Cardiology

## 2018-09-08 ENCOUNTER — Ambulatory Visit (HOSPITAL_COMMUNITY): Payer: 59

## 2018-09-08 ENCOUNTER — Ambulatory Visit (HOSPITAL_COMMUNITY)
Admission: RE | Admit: 2018-09-08 | Discharge: 2018-09-08 | Disposition: A | Discharge status: Home / Self Care | Payer: 59 | Attending: Cardiology | Admitting: Cardiology

## 2018-09-08 DIAGNOSIS — M8580 Other specified disorders of bone density and structure, unspecified site: Secondary | ICD-10-CM | POA: Insufficient documentation

## 2018-09-08 DIAGNOSIS — Z79899 Other long term (current) drug therapy: Secondary | ICD-10-CM | POA: Insufficient documentation

## 2018-09-08 DIAGNOSIS — I442 Atrioventricular block, complete: Secondary | ICD-10-CM | POA: Diagnosis present

## 2018-09-08 DIAGNOSIS — Z833 Family history of diabetes mellitus: Secondary | ICD-10-CM | POA: Diagnosis not present

## 2018-09-08 DIAGNOSIS — Z90710 Acquired absence of both cervix and uterus: Secondary | ICD-10-CM | POA: Diagnosis not present

## 2018-09-08 DIAGNOSIS — Z7982 Long term (current) use of aspirin: Secondary | ICD-10-CM | POA: Insufficient documentation

## 2018-09-08 DIAGNOSIS — Z95818 Presence of other cardiac implants and grafts: Secondary | ICD-10-CM

## 2018-09-08 DIAGNOSIS — Z8249 Family history of ischemic heart disease and other diseases of the circulatory system: Secondary | ICD-10-CM | POA: Insufficient documentation

## 2018-09-08 DIAGNOSIS — E119 Type 2 diabetes mellitus without complications: Secondary | ICD-10-CM | POA: Diagnosis not present

## 2018-09-08 DIAGNOSIS — I10 Essential (primary) hypertension: Secondary | ICD-10-CM | POA: Diagnosis not present

## 2018-09-08 DIAGNOSIS — Z880 Allergy status to penicillin: Secondary | ICD-10-CM | POA: Insufficient documentation

## 2018-09-08 HISTORY — PX: PACEMAKER IMPLANT: EP1218

## 2018-09-08 LAB — BASIC METABOLIC PANEL
Anion gap: 14 (ref 5–15)
BUN: 23 mg/dL (ref 8–23)
CO2: 22 mmol/L (ref 22–32)
Calcium: 9.3 mg/dL (ref 8.9–10.3)
Chloride: 103 mmol/L (ref 98–111)
Creatinine, Ser: 1.26 mg/dL — ABNORMAL HIGH (ref 0.44–1.00)
GFR calc Af Amer: 53 mL/min — ABNORMAL LOW (ref >60)
GFR calc non Af Amer: 45 mL/min — ABNORMAL LOW (ref >60)
Glucose, Bld: 141 mg/dL — ABNORMAL HIGH (ref 70–99)
Potassium: 3.8 mmol/L (ref 3.5–5.1)
Sodium: 139 mmol/L (ref 135–145)

## 2018-09-08 LAB — CBC
HCT: 41.5 % (ref 36.0–46.0)
Hemoglobin: 13.6 g/dL (ref 12.0–15.0)
MCH: 29.6 pg (ref 26.0–34.0)
MCHC: 32.8 g/dL (ref 30.0–36.0)
MCV: 90.2 fL (ref 80.0–100.0)
Platelets: 291 10*3/uL (ref 150–400)
RBC: 4.6 MIL/uL (ref 3.87–5.11)
RDW: 12.9 % (ref 11.5–15.5)
WBC: 13.2 10*3/uL — ABNORMAL HIGH (ref 4.0–10.5)
nRBC: 0 % (ref 0.0–0.2)

## 2018-09-08 LAB — ECHOCARDIOGRAM LIMITED
Height: 65 in
Weight: 3168 oz

## 2018-09-08 LAB — GLUCOSE, CAPILLARY: Glucose-Capillary: 139 mg/dL — ABNORMAL HIGH (ref 70–99)

## 2018-09-08 LAB — SURGICAL PCR SCREEN
MRSA, PCR: NEGATIVE
Staphylococcus aureus: POSITIVE — AB

## 2018-09-08 SURGERY — PACEMAKER IMPLANT

## 2018-09-08 MED ORDER — FENTANYL CITRATE (PF) 100 MCG/2ML IJ SOLN
INTRAMUSCULAR | Status: DC | PRN
  Administered 2018-09-08 (×2): 25 ug via INTRAVENOUS

## 2018-09-08 MED ORDER — SODIUM CHLORIDE 0.9 % IV SOLN
INTRAVENOUS | Status: DC
  Administered 2018-09-08: 06:00:00 via INTRAVENOUS

## 2018-09-08 MED ORDER — SODIUM CHLORIDE 0.9 % IV SOLN
80.0000 mg | INTRAVENOUS | Status: AC
  Administered 2018-09-08: 80 mg

## 2018-09-08 MED ORDER — LIDOCAINE HCL (PF) 1 % IJ SOLN
INTRAMUSCULAR | Status: DC | PRN
  Administered 2018-09-08: 45 mL

## 2018-09-08 MED ORDER — FENTANYL CITRATE (PF) 100 MCG/2ML IJ SOLN
INTRAMUSCULAR | Status: AC
  Filled 2018-09-08: qty 2

## 2018-09-08 MED ORDER — SODIUM CHLORIDE 0.9 % IV SOLN
INTRAVENOUS | Status: AC
  Filled 2018-09-08: qty 2

## 2018-09-08 MED ORDER — LIDOCAINE HCL 1 % IJ SOLN
INTRAMUSCULAR | Status: AC
  Filled 2018-09-08: qty 60

## 2018-09-08 MED ORDER — ACETAMINOPHEN 325 MG PO TABS: 325.0000 mg | ORAL_TABLET | ORAL | Status: DC | PRN

## 2018-09-08 MED ORDER — VANCOMYCIN HCL IN DEXTROSE 1-5 GM/200ML-% IV SOLN
INTRAVENOUS | Status: AC
  Filled 2018-09-08: qty 200

## 2018-09-08 MED ORDER — MIDAZOLAM HCL 5 MG/5ML IJ SOLN
INTRAMUSCULAR | Status: AC
  Filled 2018-09-08: qty 5

## 2018-09-08 MED ORDER — MUPIROCIN 2 % EX OINT
TOPICAL_OINTMENT | Freq: Once | CUTANEOUS | Status: AC
  Administered 2018-09-08: 06:00:00 via NASAL
  Filled 2018-09-08: qty 22

## 2018-09-08 MED ORDER — MIDAZOLAM HCL 5 MG/5ML IJ SOLN
INTRAMUSCULAR | Status: DC | PRN
  Administered 2018-09-08 (×2): 1 mg via INTRAVENOUS

## 2018-09-08 MED ORDER — HEPARIN (PORCINE) IN NACL 1000-0.9 UT/500ML-% IV SOLN
INTRAVENOUS | Status: AC
  Filled 2018-09-08: qty 500

## 2018-09-08 MED ORDER — VANCOMYCIN HCL IN DEXTROSE 1-5 GM/200ML-% IV SOLN: 1000.0000 mg | Freq: Two times a day (BID) | INTRAVENOUS | Status: DC

## 2018-09-08 MED ORDER — VANCOMYCIN HCL IN DEXTROSE 1-5 GM/200ML-% IV SOLN
1000.0000 mg | INTRAVENOUS | Status: AC
  Administered 2018-09-08: 1000 mg via INTRAVENOUS

## 2018-09-08 MED ORDER — CHLORHEXIDINE GLUCONATE 4 % EX LIQD: 60.0000 mL | Freq: Once | CUTANEOUS | Status: DC

## 2018-09-08 MED ORDER — ONDANSETRON HCL 4 MG/2ML IJ SOLN: 4.0000 mg | Freq: Four times a day (QID) | INTRAMUSCULAR | Status: DC | PRN

## 2018-09-08 SURGICAL SUPPLY — 13 items
CABLE SURGICAL S-101-97-12 (CABLE) ×2 IMPLANT
CATH RIGHTSITE C315HIS02 (CATHETERS) ×1 IMPLANT
COVER DOME SNAP 22 D (MISCELLANEOUS) ×1 IMPLANT
IPG PACE AZUR XT DR MRI W1DR01 (Pacemaker) IMPLANT
LEAD CAPSURE NOVUS 5076-52CM (Lead) ×1 IMPLANT
LEAD SELECT SECURE 3830 383069 (Lead) IMPLANT
PACE AZURE XT DR MRI W1DR01 (Pacemaker) ×2 IMPLANT
PAD PRO RADIOLUCENT 2001M-C (PAD) ×2 IMPLANT
SELECT SECURE 3830 383069 (Lead) ×2 IMPLANT
SHEATH CLASSIC 7F (SHEATH) ×2 IMPLANT
SLITTER 6232ADJ (MISCELLANEOUS) ×1 IMPLANT
TRAY PACEMAKER INSERTION (PACKS) ×2 IMPLANT
WIRE HI TORQ VERSACORE-J 145CM (WIRE) ×1 IMPLANT

## 2018-09-08 NOTE — Progress Notes (Signed)
  Echocardiogram 2D Echocardiogram has been performed.  [name] 09/08/2018, 4:30 PM

## 2018-09-08 NOTE — H&P (Signed)
Cardiology Consultation:   Patient ID: [name] MRN: [id number]; DOB: Sep 03, 1955  Admit date: 09/08/2018 Date of Consult: 09/08/2018  Primary Care Provider: Haywood Pao, MD Primary Cardiologist: Minus Breeding, MD  Primary Electrophysiologist:  Curt Bears   Patient Profile:   [name] is a 63 y.o. female with a hx of DM, HTN who is being seen today for the evaluation of heart block at the request of Marijo File.  She presented to cardiology clinic after noticing that her heart rate went down into the 40s despite being taken off of her carvedilol.  Since then she consistently is in the 57s.  She has decreased exercise tolerance but no history of syncope or presyncope.  While walking around in clinic, she did get her heart rate into the low 60s.  She is also having increased leg swelling.  ECG shows complete heart block with a narrow escape.  History of Present Illness:   Ms. [name] she has a history significant for diabetes and hypertension.  Past Medical History:  Diagnosis Date  . Diabetes mellitus   . Hypertension   . Osteopenia 03/2018   T score -1.1 FRAX 7% / 0.5%    Past Surgical History:  Procedure Laterality Date  . DILATION AND CURETTAGE OF UTERUS    . HYSTEROSCOPY     AND D&C/FOR ENDO POLYP  . LASIK    . TUBAL LIGATION  1999  . VAGINAL HYSTERECTOMY  2004     Home Medications:  Prior to Admission medications   Medication Sig Start Date End Date Taking? Authorizing Provider  aspirin EC 81 MG tablet Take 81 mg by mouth every evening.   Yes [provider]  atorvastatin (LIPITOR) 10 MG tablet Take 5 mg by mouth daily.   Yes [provider]  Bioflavonoid Products (ESTER-C) 500-550 MG TABS Take 1 tablet by mouth daily.   Yes [provider]  cetirizine (ZYRTEC) 10 MG tablet Take 10 mg by mouth every evening.   Yes [provider]  CINNAMON PO Take 1,500 mg by mouth daily. Cinnamon Complex   Yes [provider]   estradiol (VIVELLE-DOT) 0.0375 MG/24HR PLACE 1 PATCH ONTO THE SKIN2 TIMES A WEEK Patient taking differently: Place 1 patch onto the skin 2 (two) times a week. Tuesdays & Fridays. 04/07/18  Yes Fontaine, Belinda Block, MD  JENTADUETO 2.08-998 MG TABS Take 1 tablet by mouth 2 (two) times a day. 08/31/18  Yes [provider]  lisinopril (PRINIVIL,ZESTRIL) 40 MG tablet Take 40 mg by mouth daily.     Yes [provider]  Multiple Vitamin (MULTIVITAMIN WITH MINERALS) TABS tablet Take 1 tablet by mouth daily. Women's One-A-Day Multivitamin   Yes [provider]  naproxen sodium (ALEVE) 220 MG tablet Take 220 mg by mouth 2 (two) times daily as needed (pain.).   Yes [provider]  Polyethyl Glycol-Propyl Glycol (SYSTANE ULTRA) 0.4-0.3 % SOLN Place 1 drop into both eyes 2 (two) times a day.   Yes [provider]  Potassium Gluconate 595 MG CAPS Take 595 mg by mouth daily.   Yes [provider]    Inpatient Medications: Scheduled Meds: . chlorhexidine  60 mL Topical Once  . gentamicin irrigation  80 mg Irrigation On Call   Continuous Infusions: . sodium chloride 50 mL/hr at 09/08/18 0622   PRN Meds:   Allergies:    Allergies  Allergen Reactions  . Penicillins Rash    Did it involve swelling of the face/tongue/throat,  SOB, or low BP? No Did it involve sudden or severe rash/hives, skin peeling, or any reaction on the inside of your mouth or nose? No Did you need to seek medical attention at a hospital or doctor's office? No When did it last happen?45 years ago If all above answers are "NO", may proceed with cephalosporin use.     Social History:   Social History   Socioeconomic History  . Marital status: Divorced    Spouse name: Not on file  . Number of children: Not on file  . Years of education: Not on file  . Highest education level: Not on file  Occupational History  . Not on file  Social Needs  . Financial resource strain:  Not on file  . Food insecurity:    Worry: Not on file    Inability: Not on file  . Transportation needs:    Medical: Not on file    Non-medical: Not on file  Tobacco Use  . Smoking status: Never Smoker  . Smokeless tobacco: Never Used  Substance and Sexual Activity  . Alcohol use: Yes    Alcohol/week: 4.0 standard drinks    Types: 4 Standard drinks or equivalent per week  . Drug use: No  . Sexual activity: Yes    Birth control/protection: Surgical    Comment: HYST-1st intercourse 63 yo-More than 5 partners  Lifestyle  . Physical activity:    Days per week: Not on file    Minutes per session: Not on file  . Stress: Not on file  Relationships  . Social connections:    Talks on phone: Not on file    Gets together: Not on file    Attends religious service: Not on file    Active member of club or organization: Not on file    Attends meetings of clubs or organizations: Not on file    Relationship status: Not on file  . Intimate partner violence:    Fear of current or ex partner: Not on file    Emotionally abused: Not on file    Physically abused: Not on file    Forced sexual activity: Not on file  Other Topics Concern  . Not on file  Social History Narrative   Lives alone.      Family History:    Family History  Problem Relation Age of Onset  . Hypertension Mother   . Diabetes Mother   . Heart disease Mother 82       CABG, did not recover  . Diabetes Father   . Diabetes Brother   . Diabetes Paternal Aunt      ROS:  Please see the history of present illness.   All other ROS reviewed and negative.     Physical Exam/Data:   Vitals:   09/08/18 0547 09/08/18 0747  BP: (!) 207/52   Pulse: (!) 45   Resp: 16   Temp: 98.1 F (36.7 C)   SpO2: 98% 100%  Weight: 89.8 kg   Height: 5\' 5"  (1.651 m)    No intake or output data in the 24 hours ending 09/08/18 0757 Last 3 Weights 09/08/2018 09/04/2018 08/29/2018  Weight (lbs) 198 lb 205 lb 201 lb  Weight (kg) 89.812 kg  92.987 kg 91.173 kg     Body mass index is 32.95 kg/m.  General:  Well nourished, well developed, in no acute distress HEENT: normal Lymph: no adenopathy Neck: no JVD Endocrine:  No thryomegaly Vascular: No carotid bruits; FA pulses 2+  bilaterally without bruits  Cardiac:  normal S1, S2; bradycardic; no murmur  Lungs:  clear to auscultation bilaterally, no wheezing, rhonchi or rales  Abd: soft, nontender, no hepatomegaly  Ext: no edema Musculoskeletal:  No deformities, BUE and BLE strength normal and equal Skin: warm and dry  Neuro:  CNs 2-12 intact, no focal abnormalities noted Psych:  Normal affect   EKG:  The EKG was personally reviewed and demonstrates: Sinus rhythm, complete heart block, junctional escape  Relevant CV Studies: Bedside echo with a normal ejection fraction, formal echo to follow  Laboratory Data:  Chemistry Recent Labs  Lab 09/08/18 0557  NA 139  K 3.8  CL 103  CO2 22  GLUCOSE 141*  BUN 23  CREATININE 1.26*  CALCIUM 9.3  GFRNONAA 45*  GFRAA 53*  ANIONGAP 14    No results for input(s): PROT, ALBUMIN, AST, ALT, ALKPHOS, BILITOT in the last 168 hours. Hematology Recent Labs  Lab 09/08/18 0557  WBC 13.2*  RBC 4.60  HGB 13.6  HCT 41.5  MCV 90.2  MCH 29.6  MCHC 32.8  RDW 12.9  PLT 291   Cardiac EnzymesNo results for input(s): TROPONINI in the last 168 hours. No results for input(s): TROPIPOC in the last 168 hours.  BNPNo results for input(s): BNP, PROBNP in the last 168 hours.  DDimer No results for input(s): DDIMER in the last 168 hours.  Radiology/Studies:  No results found.  Assessment and Plan:   1. Complete heart block: At this point there is no reversible cause.  She has weakness and fatigue.  She would best benefit from permanent pacemaker implantation.  We Fritz Cauthon plan for today.  [name] has presented today for surgery, with the diagnosis of  complete heart block.  The various methods of treatment have been discussed with  the patient and family. After consideration of risks, benefits and other options for treatment, the patient has consented to  Procedure(s): Pacemaker implant as a surgical intervention .  Risks include but not limited to bleeding, tamponade, infection, pneumothorax, among others. The patient's history has been reviewed, patient examined, no change in status, stable for surgery.  I have reviewed the patient's chart and labs.  Questions were answered to the patient's satisfaction.     For questions or updates, please contact [place] Please consult [email or web address] for contact info under     Signed, Raghad Lorenz Meredith Leeds, MD  09/08/2018 7:57 AM

## 2018-09-08 NOTE — Progress Notes (Signed)
Dr Curt Bears notified of cxr results and ok to d/c home

## 2018-09-08 NOTE — Discharge Instructions (Signed)
09/09/2018, TUESDAY, PLEASE SEND A REMOTE PACEMAKER TRANSMISSION  and  PLEASE SIGN UP TO YOUR MY CHART ACCOUNT WHEN YOU GET HOME (if you aren't already).  IN THE CURRENT ENVIRONMENT WITH COVID-19, IN EFFORT TO REDUCE YOUR EXPOSURE WE WILL BE CONDUCTING MANY PATIENT VISITS BY EITHER VIRTUAL/VIDEO VISITS or TELEPHONE VISITS.  BEING SIGNED UP IN YOUR MY CHART ACCOUNT  WILL HELP FACILITATE THESE VISITS AND OUR COMMUNICATION WITH YOU       Supplemental Discharge Instructions for  Pacemaker/Defibrillator Patients  Activity No heavy lifting or vigorous activity with your left/right arm for 6 to 8 weeks.  Do not raise your left/right arm above your head for one week.  Gradually raise your affected arm as drawn below.              09/12/2018                09/13/2018                 09/14/2018               09/15/2018 __  NO DRIVING until cleared to at your wound check visit.  WOUND CARE - Keep the wound area clean and dry.  Do not get this area wet, no showers until cleared to at your wound check visit - The tape/steri-strips on your wound will fall off; do not pull them off.  No bandage is needed on the site.  DO  NOT apply any creams, oils, or ointments to the wound area. - If you notice any drainage or discharge from the wound, any swelling or bruising at the site, or you develop a fever > 101? F after you are discharged home, call the office at once.  Special Instructions - You are still able to use cellular telephones; use the ear opposite the side where you have your pacemaker/defibrillator.  Avoid carrying your cellular phone near your device. - When traveling through airports, show security personnel your identification card to avoid being screened in the metal detectors.  Ask the security personnel to use the hand wand. - Avoid arc welding equipment, MRI testing (magnetic resonance imaging), TENS units (transcutaneous nerve stimulators).  Call the office for questions about other  devices. - Avoid electrical appliances that are in poor condition or are not properly grounded. - Microwave ovens are safe to be near or to operate.    Pacemaker Implantation, Adult, Care After    This sheet gives you information about how to care for yourself after your procedure. Your health care provider may also give you more specific instructions. If you have problems or questions, contact your health care provider. What can I expect after the procedure? After the procedure, it is common to have:  Mild pain.  Slight bruising.  Some swelling over the incision.  A slight bump over the skin where the device was placed. Sometimes, it is possible to feel the device under the skin. This is normal. Follow these instructions at home: Medicines  Take over-the-counter and prescription medicines only as told by your health care provider.  If you were prescribed an antibiotic medicine, take it as told by your health care provider. Do not stop taking the antibiotic even if you start to feel better. Wound care  DO NOT TURN TO LEFT SIDE X 24 HOURS    Do not remove the bandage on your chest until directed to do so by your health care provider.  After your bandage is removed,  you may see pieces of tape called skin adhesive strips over the area where the cut was made (incision site). Let them fall off on their own.  Check the incision site every day to make sure it is not infected, bleeding, or starting to pull apart.  Do not use lotions or ointments near the incision site unless directed to do so.  Keep the incision area clean and dry for 2-3 days after the procedure or as directed by your health care provider. It takes several weeks for the incision site to completely heal.  Do not take baths, swim, or use a hot tub for 7-10 days or as otherwise directed by your health care provider. Activity  Do not drive or use heavy machinery while taking prescription pain medicine.  Do not  drive for 24 hours if you were given a medicine to help you relax (sedative).  Check with your health care provider before you start to drive or play sports.  Avoid sudden jerking, pulling, or chopping movements that pull your upper arm far away from your body. Avoid these movements for at least 6 weeks or as long as told by your health care provider.  Do not lift your upper arm above your shoulders for at least 6 weeks or as long as told by your health care provider. This means no tennis, golf, or swimming.  You may go back to work when your health care provider says it is okay. Pacemaker care  You may be shown how to transfer data from your pacemaker through the phone to your health care provider.  Always let all health care providers know about your pacemaker before you have any medical procedures or tests.  Wear a medical ID bracelet or necklace stating that you have a pacemaker. Carry a pacemaker ID card with you at all times.  Your pacemaker battery will last for 5-15 years. Routine checks by your health care provider will let the health care provider know when the battery is starting to run down. The pacemaker will need to be replaced when the battery starts to run down.  Do not use amateur Chief of Staff. Other electrical devices are safe to use, including power tools, lawn mowers, and speakers. If you are unsure of whether something is safe to use, ask your health care provider.  When using your cell phone, hold it to the ear opposite the pacemaker. Do not leave your cell phone in a pocket over the pacemaker.  Avoid places or objects that have a strong electric or magnetic field, including: ? Airport Herbalist. When at the airport, let officials know that you have a pacemaker. ? Power plants. ? Large electrical generators. ? Radiofrequency transmission towers, such as cell phone and radio towers. General instructions  Weigh yourself every day.  If you suddenly gain weight, fluid may be building up in your body.  Keep all follow-up visits as told by your health care provider. This is important. Contact a health care provider if:  You gain weight suddenly.  Your legs or feet swell.  It feels like your heart is fluttering or skipping beats (heart palpitations).  You have chills or a fever.  You have more redness, swelling, or pain around your incisions.  You have more fluid or blood coming from your incisions.  Your incisions feel warm to the touch.  You have pus or a bad smell coming from your incisions. Get help right away if:  You have chest  pain.  You have trouble breathing or are short of breath.  You become extremely tired.  You are light-headed or you faint. This information is not intended to replace advice given to you by your health care provider. Make sure you discuss any questions you have with your health care provider. Document Released: 11/03/2004 Document Revised: 01/27/2016 Document Reviewed: 01/27/2016 Elsevier Interactive Patient Education  2019 [place].   Mupirocin nasal ointment// staph positive  What is this medicine? MUPIROCIN CALCIUM (myoo PEER oh sin KAL see um) is an antibiotic. It is used inside the nose to treat infections that are caused by certain bacteria. This helps prevent the spread of infection to patients and health care workers during outbreaks at institutions. This medicine may be used for other purposes; ask your health care provider or pharmacist if you have questions. COMMON BRAND NAME(S): Bactroban What should I tell my health care provider before I take this medicine? They need to know if you have any of these conditions: -an unusual or allergic reaction to mupirocin, other medicines, foods, dyes, or preservatives -pregnant or trying to get pregnant -breast-feeding How should I use this medicine? This medicine is only for use inside the nose. Follow the directions on  the prescription label. Wash your hands before and after use. Squeeze half the contents of a single-use tube into one nostril, then squeeze the other half into the other nostril. Press the sides of your nose together and gently massage after application to spread the ointment throughout the nostrils. Do not use your medicine more often than directed. Finish the full course of medicine prescribed by your doctor or health care professional even if you think your condition is better. Talk to your pediatrician regarding the use of this medicine in children. Special care may be needed. Overdosage: If you think you have taken too much of this medicine contact a poison control center or emergency room at once. NOTE: This medicine is only for you. Do not share this medicine with others. What if I miss a dose? If you miss a dose, take it as soon as you can. If it is almost time for your next dose, take only that dose. Do not take double or extra doses. What may interact with this medicine? Interactions are not expected. Do not use any other nose products without telling your doctor or health care professional. This list may not describe all possible interactions. Give your health care provider a list of all the medicines, herbs, non-prescription drugs, or dietary supplements you use. Also tell them if you smoke, drink alcohol, or use illegal drugs. Some items may interact with your medicine. What should I watch for while using this medicine? If your nose is severely irritated, burning or stinging from use of this medicine, stop using it and contact your doctor or health care professional. Do not get this medicine in your eyes. If you do, rinse out with plenty of cool tap water. What side effects may I notice from receiving this medicine? Side effects that you should report to your doctor or health care professional as soon as possible: -severe irritation, burning, stinging, or pain Side effects that usually do  not require medical attention (report to your doctor or health care professional if they continue or are bothersome): -altered taste -cough -headache -skin itching -sore throat -stuffy or runny nose This list may not describe all possible side effects. Call your doctor for medical advice about side effects. You may report side effects to  FDA at 1-800-FDA-1088. Where should I keep my medicine? Keep out of the reach of children. Store at room temperature between 15 and 30 degrees C (59 and 86 degrees F). Do not refrigerate. One tube of ointment is for single use in both nostrils. Throw away after use. NOTE: This sheet is a summary. It may not cover all possible information. If you have questions about this medicine, talk to your doctor, pharmacist, or health care provider.  2019 Elsevier/Gold Standard (2007-11-03 14:36:10)

## 2018-09-09 ENCOUNTER — Encounter (HOSPITAL_COMMUNITY): Payer: Self-pay | Admitting: Cardiology

## 2018-09-09 MED FILL — Heparin Sod (Porcine)-NaCl IV Soln 1000 Unit/500ML-0.9%: INTRAVENOUS | Qty: 500 | Status: AC

## 2018-09-09 MED FILL — Gentamicin Sulfate Inj 40 MG/ML: INTRAMUSCULAR | Qty: 80 | Status: AC

## 2018-09-09 MED FILL — Vancomycin HCl-Dextrose IV Soln 1 GM/200ML-5%: INTRAVENOUS | Qty: 200 | Status: AC

## 2018-09-09 MED FILL — Lidocaine HCl Local Inj 1%: INTRAMUSCULAR | Qty: 60 | Status: AC

## 2018-09-17 ENCOUNTER — Other Ambulatory Visit: Payer: Self-pay

## 2018-09-17 ENCOUNTER — Ambulatory Visit (INDEPENDENT_AMBULATORY_CARE_PROVIDER_SITE_OTHER): Payer: 59 | Admitting: Student

## 2018-09-17 DIAGNOSIS — I442 Atrioventricular block, complete: Secondary | ICD-10-CM

## 2018-09-17 LAB — CUP PACEART INCLINIC DEVICE CHECK
Battery Remaining Longevity: 70 mo
Battery Voltage: 3.17 V
Brady Statistic AP VP Percent: 0.19 %
Brady Statistic AP VS Percent: 0 %
Brady Statistic AS VP Percent: 99.77 %
Brady Statistic AS VS Percent: 0.04 %
Brady Statistic RA Percent Paced: 0.19 %
Brady Statistic RV Percent Paced: 99.96 %
Date Time Interrogation Session: 20200520124054
Implantable Lead Implant Date: 20200511
Implantable Lead Implant Date: 20200511
Implantable Lead Location: 753859
Implantable Lead Location: 753860
Implantable Lead Model: 3830
Implantable Lead Model: 5076
Implantable Pulse Generator Implant Date: 20200511
Lead Channel Impedance Value: 304 Ohm
Lead Channel Impedance Value: 380 Ohm
Lead Channel Impedance Value: 399 Ohm
Lead Channel Impedance Value: 494 Ohm
Lead Channel Pacing Threshold Amplitude: 0.5 V
Lead Channel Pacing Threshold Amplitude: 0.75 V
Lead Channel Pacing Threshold Pulse Width: 0.4 ms
Lead Channel Pacing Threshold Pulse Width: 0.4 ms
Lead Channel Sensing Intrinsic Amplitude: 4.5 mV
Lead Channel Sensing Intrinsic Amplitude: 5.625 mV
Lead Channel Sensing Intrinsic Amplitude: 7.5 mV
Lead Channel Setting Pacing Amplitude: 3.5 V
Lead Channel Setting Pacing Amplitude: 3.5 V
Lead Channel Setting Pacing Pulse Width: 1 ms
Lead Channel Setting Sensing Sensitivity: 2.8 mV

## 2018-09-17 NOTE — Progress Notes (Signed)
Wound check appointment. Steri-strips removed. Wound without redness or edema. Incision edges approximated, wound well healed. Normal device function. Thresholds, sensing, and impedances consistent with implant measurements. Pt appears to have Non-selective His bundle pacing down to 1.0V at 1.0 ms. Appears to be septal pacing from 0.75 V at 1.0 ms down to loss of capture at 0.25 V. Device programmed at 3.5V for extra safety margin until 3 month visit. HIS Bundle programmed to 3.5V from 5V. Histogram distribution appropriate for patient and level of activity. No mode switches or high ventricular rates noted. AT/AF burden alert turned on. Patient educated about wound care, arm mobility, lifting restrictions. ROV in 3 months with Dr. Curt Bears.   [name] [address]" [place], PA-C 09/17/2018 12:41 PM

## 2018-09-18 ENCOUNTER — Other Ambulatory Visit: Payer: Self-pay | Admitting: Cardiology

## 2018-10-08 ENCOUNTER — Encounter: Payer: Self-pay | Admitting: Gynecology

## 2018-10-08 ENCOUNTER — Ambulatory Visit (INDEPENDENT_AMBULATORY_CARE_PROVIDER_SITE_OTHER): Payer: 59 | Admitting: Gynecology

## 2018-10-08 ENCOUNTER — Other Ambulatory Visit: Payer: Self-pay

## 2018-10-08 VITALS — BP 156/108

## 2018-10-08 DIAGNOSIS — R300 Dysuria: Secondary | ICD-10-CM

## 2018-10-08 DIAGNOSIS — N3001 Acute cystitis with hematuria: Secondary | ICD-10-CM

## 2018-10-08 MED ORDER — SULFAMETHOXAZOLE-TRIMETHOPRIM 800-160 MG PO TABS: 1.0000 | ORAL_TABLET | Freq: Two times a day (BID) | ORAL | 0 refills | Status: DC

## 2018-10-08 NOTE — Patient Instructions (Signed)
Recheck your blood pressure at home.  Follow-up with your primary physician if it remains elevated.  Take the antibiotic twice daily for 3 days.  Follow-up if your symptoms persist, worsen or recur.

## 2018-10-08 NOTE — Progress Notes (Signed)
    [name] Apr 24, 1956 [id number]        63 y.o.  G1P0010 presents with 1 day history of worsening dysuria, frequency and urgency.  No low back pain fever or chills.  Noticed some blood in her urine this morning.  No vaginal symptoms such as discharge odor or irritation.  Past medical history,surgical history, problem list, medications, allergies, family history and social history were all reviewed and documented in the EPIC chart.  Directed ROS with pertinent positives and negatives documented in the history of present illness/assessment and plan.  Exam: Vitals:   10/08/18 0912  BP: (!) 156/108   General appearance:  Normal Spine straight without CVA tenderness Abdomen soft nontender without masses guarding rebound  Assessment/Plan:  63 y.o. G1P0010 with history and urine analysis consistent with UTI.  Will treat with Septra DS 1 p.o. twice daily x3 days.  Follow-up if symptoms persist, worsen or recurs.  Discussed elevated blood pressure 156/108.  Need to recheck at home which she can do.  Recent doctor's visit reportedly normal blood pressure.  We will follow-up with her primary provider if remains elevated.  Without headache or visual changes today.    [name] MD, 9:39 AM 10/08/2018

## 2018-10-10 LAB — URINALYSIS, COMPLETE W/RFL CULTURE: Hyaline Cast: NONE SEEN /LPF

## 2018-10-10 LAB — URINE CULTURE
MICRO NUMBER:: 559894
SPECIMEN QUALITY:: ADEQUATE

## 2018-10-10 LAB — CULTURE INDICATED

## 2018-12-08 ENCOUNTER — Ambulatory Visit (INDEPENDENT_AMBULATORY_CARE_PROVIDER_SITE_OTHER): Payer: 59 | Admitting: *Deleted

## 2018-12-08 DIAGNOSIS — R001 Bradycardia, unspecified: Secondary | ICD-10-CM

## 2018-12-08 DIAGNOSIS — I442 Atrioventricular block, complete: Secondary | ICD-10-CM | POA: Diagnosis not present

## 2018-12-08 LAB — CUP PACEART REMOTE DEVICE CHECK
Battery Remaining Longevity: 63 mo
Battery Voltage: 3.05 V
Brady Statistic AP VP Percent: 0.41 %
Brady Statistic AP VS Percent: 0 %
Brady Statistic AS VP Percent: 99.55 %
Brady Statistic AS VS Percent: 0.04 %
Brady Statistic RA Percent Paced: 0.41 %
Brady Statistic RV Percent Paced: 99.96 %
Date Time Interrogation Session: 20200810103849
Implantable Lead Implant Date: 20200511
Implantable Lead Implant Date: 20200511
Implantable Lead Location: 753859
Implantable Lead Location: 753860
Implantable Lead Model: 3830
Implantable Lead Model: 5076
Implantable Pulse Generator Implant Date: 20200511
Lead Channel Impedance Value: 304 Ohm
Lead Channel Impedance Value: 323 Ohm
Lead Channel Impedance Value: 399 Ohm
Lead Channel Impedance Value: 418 Ohm
Lead Channel Pacing Threshold Amplitude: 0.375 V
Lead Channel Pacing Threshold Amplitude: 1.25 V
Lead Channel Pacing Threshold Pulse Width: 0.4 ms
Lead Channel Pacing Threshold Pulse Width: 0.4 ms
Lead Channel Sensing Intrinsic Amplitude: 4.75 mV
Lead Channel Sensing Intrinsic Amplitude: 4.75 mV
Lead Channel Sensing Intrinsic Amplitude: 7.5 mV
Lead Channel Setting Pacing Amplitude: 3.25 V
Lead Channel Setting Pacing Amplitude: 3.5 V
Lead Channel Setting Pacing Pulse Width: 1 ms
Lead Channel Setting Sensing Sensitivity: 2.8 mV

## 2018-12-09 ENCOUNTER — Ambulatory Visit (INDEPENDENT_AMBULATORY_CARE_PROVIDER_SITE_OTHER): Payer: 59 | Admitting: Cardiology

## 2018-12-09 ENCOUNTER — Other Ambulatory Visit: Payer: Self-pay

## 2018-12-09 ENCOUNTER — Encounter: Payer: Self-pay | Admitting: Cardiology

## 2018-12-09 VITALS — BP 146/92 | HR 102 | Ht 65.0 in | Wt 201.0 lb

## 2018-12-09 DIAGNOSIS — I442 Atrioventricular block, complete: Secondary | ICD-10-CM

## 2018-12-09 LAB — CUP PACEART INCLINIC DEVICE CHECK
Battery Remaining Longevity: 96 mo
Battery Voltage: 3.05 V
Brady Statistic AP VP Percent: 0.41 %
Brady Statistic AP VS Percent: 0 %
Brady Statistic AS VP Percent: 99.56 %
Brady Statistic AS VS Percent: 0.04 %
Brady Statistic RA Percent Paced: 0.41 %
Brady Statistic RV Percent Paced: 99.96 %
Date Time Interrogation Session: 20200811170958
Implantable Lead Implant Date: 20200511
Implantable Lead Implant Date: 20200511
Implantable Lead Location: 753859
Implantable Lead Location: 753860
Implantable Lead Model: 3830
Implantable Lead Model: 5076
Implantable Pulse Generator Implant Date: 20200511
Lead Channel Impedance Value: 323 Ohm
Lead Channel Impedance Value: 342 Ohm
Lead Channel Impedance Value: 380 Ohm
Lead Channel Impedance Value: 437 Ohm
Lead Channel Pacing Threshold Amplitude: 0.5 V
Lead Channel Pacing Threshold Amplitude: 1.25 V
Lead Channel Pacing Threshold Pulse Width: 0.4 ms
Lead Channel Pacing Threshold Pulse Width: 0.4 ms
Lead Channel Sensing Intrinsic Amplitude: 6.75 mV
Lead Channel Sensing Intrinsic Amplitude: 8.25 mV
Lead Channel Setting Pacing Amplitude: 2 V
Lead Channel Setting Pacing Amplitude: 2.5 V
Lead Channel Setting Pacing Pulse Width: 1 ms
Lead Channel Setting Sensing Sensitivity: 2.8 mV

## 2018-12-09 MED ORDER — CARVEDILOL 6.25 MG PO TABS: 6.2500 mg | ORAL_TABLET | Freq: Two times a day (BID) | ORAL | 11 refills | Status: DC

## 2018-12-09 NOTE — Patient Instructions (Addendum)
Medication Instructions:  Your physician has recommended you make the following change in your medication:  1. START Carvedilol 6.25 mg twice daily  *If you need a refill on your cardiac medications before your next appointment, please call your pharmacy*  Labwork: None ordered  Testing/Procedures: None ordered  Follow-Up: Remote monitoring is used to monitor your Pacemaker or ICD from home. This monitoring reduces the number of office visits required to check your device to one time per year. It allows [place] to keep an eye on the functioning of your device to ensure it is working properly. You are scheduled for a device check from home on 03/10/19. You may send your transmission at any time that day. If you have a wireless device, the transmission will be sent automatically. After your physician reviews your transmission, you will receive a postcard with your next transmission date.  Your physician wants you to follow-up in: 9 months with Dr. Curt Bears.  You will receive a reminder letter in the mail two months in advance. If you don't receive a letter, please call our office to schedule the follow-up appointment.  Thank you for choosing [facility]!!   Trinidad Curet, RN [phone]

## 2018-12-09 NOTE — Progress Notes (Signed)
Electrophysiology Office Note   Date:  12/09/2018   ID:  [name], DOB 01/04/1956, MRN [id number]  PCP:  Haywood Pao, MD  Cardiologist:  [facility] Primary Electrophysiologist:  Athol Bolds Meredith Leeds, MD    No chief complaint on file.    History of Present Illness: [name] is a 63 y.o. female who is being seen today for the evaluation of heart block at the request of Tisovec, Fransico Him, MD. Presenting today for electrophysiology evaluation.  She has a history of diabetes and hypertension.  She presented to the hospital May 2020 with episodes of heart block.  She is now status post Medtronic dual-chamber pacemaker implanted 09/08/2018.    Today, she denies symptoms of palpitations, chest pain, shortness of breath, orthopnea, PND, lower extremity edema, claudication, dizziness, presyncope, syncope, bleeding, or neurologic sequela. The patient is tolerating medications without difficulties.    Past Medical History:  Diagnosis Date  . Diabetes mellitus   . Hypertension   . Osteopenia 03/2018   T score -1.1 FRAX 7% / 0.5%   Past Surgical History:  Procedure Laterality Date  . DILATION AND CURETTAGE OF UTERUS    . HYSTEROSCOPY     AND D&C/FOR ENDO POLYP  . LASIK    . PACEMAKER IMPLANT N/A 09/08/2018   Procedure: PACEMAKER IMPLANT;  Surgeon: Constance Haw, MD;  Location: [facility];  Service: Cardiovascular;  Laterality: N/A;  . TUBAL LIGATION  1999  . VAGINAL HYSTERECTOMY  2004     Current Outpatient Medications  Medication Sig Dispense Refill  . aspirin EC 81 MG tablet Take 81 mg by mouth every evening.    Marland Kitchen atorvastatin (LIPITOR) 10 MG tablet Take 5 mg by mouth daily.    Marland Kitchen Bioflavonoid Products (ESTER-C) 500-550 MG TABS Take 1 tablet by mouth daily.    . cetirizine (ZYRTEC) 10 MG tablet Take 10 mg by mouth every evening.    . cholecalciferol (VITAMIN D3) 25 MCG (1000 UT) tablet Take 1,000 Units by mouth daily.    Marland Kitchen CINNAMON PO Take 1,500 mg by  mouth daily. Cinnamon Complex    . estradiol (VIVELLE-DOT) 0.0375 MG/24HR PLACE 1 PATCH ONTO THE SKIN2 TIMES A WEEK (Patient taking differently: Place 1 patch onto the skin 2 (two) times a week. Tuesdays & Fridays.) 24 patch 4  . JENTADUETO 2.08-998 MG TABS Take 1 tablet by mouth 2 (two) times a day.    . lisinopril (PRINIVIL,ZESTRIL) 40 MG tablet Take 40 mg by mouth daily.      . Multiple Vitamin (MULTIVITAMIN WITH MINERALS) TABS tablet Take 1 tablet by mouth daily. Women's One-A-Day Multivitamin    . Multiple Vitamins-Minerals (ZINC PO) Take 40 mg by mouth daily.     . naproxen sodium (ALEVE) 220 MG tablet Take 220 mg by mouth 2 (two) times daily as needed (pain.).    Marland Kitchen Polyethyl Glycol-Propyl Glycol (SYSTANE ULTRA) 0.4-0.3 % SOLN Place 1 drop into both eyes 2 (two) times a day.    . Potassium Gluconate 595 MG CAPS Take 595 mg by mouth daily.    . carvedilol (COREG) 6.25 MG tablet Take 1 tablet (6.25 mg total) by mouth 2 (two) times daily. 60 tablet 11   No current facility-administered medications for this visit.     Allergies:   Penicillins   Social History:  The patient  reports that she has never smoked. She has never used smokeless tobacco. She reports current alcohol use of about 4.0 standard drinks  of alcohol per week. She reports that she does not use drugs.   Family History:  The patient's family history includes Diabetes in her brother, father, mother, and paternal aunt; Heart disease (age of onset: 88) in her mother; Hypertension in her mother.    ROS:  Please see the history of present illness.   Otherwise, review of systems is positive for none.   All other systems are reviewed and negative.    PHYSICAL EXAM: VS:  BP (!) 146/92   Pulse (!) 102   Ht 5\' 5"  (1.651 m)   Wt 201 lb (91.2 kg)   BMI 33.45 kg/m  , BMI Body mass index is 33.45 kg/m. GEN: Well nourished, well developed, in no acute distress  HEENT: normal  Neck: no JVD, carotid bruits, or masses Cardiac: RRR;  no murmurs, rubs, or gallops,no edema  Respiratory:  clear to auscultation bilaterally, normal work of breathing GI: soft, nontender, nondistended, + BS MS: no deformity or atrophy  Skin: warm and dry, device pocket is well healed Neuro:  Strength and sensation are intact Psych: euthymic mood, full affect  EKG:  EKG is ordered today. Personal review of the ekg ordered shows sinus tachycardia, rate 102, ventricular paced  Device interrogation is reviewed today in detail.  See PaceArt for details.   Recent Labs: 09/08/2018: BUN 23; Creatinine, Ser 1.26; Hemoglobin 13.6; Platelets 291; Potassium 3.8; Sodium 139    Lipid Panel  No results found for: CHOL, TRIG, HDL, CHOLHDL, VLDL, LDLCALC, LDLDIRECT   Wt Readings from Last 3 Encounters:  12/09/18 201 lb (91.2 kg)  09/08/18 198 lb (89.8 kg)  09/04/18 205 lb (93 kg)      Other studies Reviewed: Additional studies/ records that were reviewed today include: TTE 09/08/18  Review of the above records today demonstrates:   1. The left ventricle has hyperdynamic systolic function, with an ejection fraction of >65%. Left ventricular diastolic Doppler parameters are consistent with impaired relaxation.  2. The right ventricle has normal systolc function. The cavity was normal. There is no increase in right ventricular wall thickness.  3. No stenosis of the aortic valve.  4. The aortic root and ascending aorta are normal in size and structure.   ASSESSMENT AND PLAN:  1.  Complete heart block: Status post Medtronic dual-chamber pacemaker implanted 09/08/2018.  Device functioning appropriately.  No changes.  2.  Hypertension: Pressure is elevated today.  At home it is generally above 458 systolic.  Due to that, we Jasraj Lappe start her on carvedilol 6.25 mg in addition to her lisinopril.  This can be adjusted by her primary physician.  Current medicines are reviewed at length with the patient today.   The patient does not have concerns regarding  her medicines.  The following changes were made today: Start carvedilol  Labs/ tests ordered today include:  Orders Placed This Encounter  Procedures  . EKG 12-Lead     Disposition:   FU with Chidi Shirer 9 months  Signed, Sharlisa Hollifield Meredith Leeds, MD  12/09/2018 3:31 PM     [address] [place] [address] [phone] (office) [phone] (fax)

## 2018-12-15 NOTE — Progress Notes (Signed)
Remote pacemaker transmission.   

## 2019-01-27 ENCOUNTER — Encounter: Payer: Self-pay | Admitting: Gynecology

## 2019-03-16 ENCOUNTER — Ambulatory Visit (INDEPENDENT_AMBULATORY_CARE_PROVIDER_SITE_OTHER): Payer: 59 | Admitting: *Deleted

## 2019-03-16 DIAGNOSIS — I442 Atrioventricular block, complete: Secondary | ICD-10-CM | POA: Diagnosis not present

## 2019-03-20 ENCOUNTER — Other Ambulatory Visit: Payer: Self-pay

## 2019-03-20 LAB — CUP PACEART REMOTE DEVICE CHECK
Battery Remaining Longevity: 95 mo
Battery Voltage: 3.02 V
Brady Statistic AP VP Percent: 0.2 %
Brady Statistic AP VS Percent: 0 %
Brady Statistic AS VP Percent: 99.79 %
Brady Statistic AS VS Percent: 0.01 %
Brady Statistic RA Percent Paced: 0.21 %
Brady Statistic RV Percent Paced: 99.99 %
Date Time Interrogation Session: 20201120052942
Implantable Lead Implant Date: 20200511
Implantable Lead Implant Date: 20200511
Implantable Lead Location: 753859
Implantable Lead Location: 753860
Implantable Lead Model: 3830
Implantable Lead Model: 5076
Implantable Pulse Generator Implant Date: 20200511
Lead Channel Impedance Value: 304 Ohm
Lead Channel Impedance Value: 323 Ohm
Lead Channel Impedance Value: 380 Ohm
Lead Channel Impedance Value: 437 Ohm
Lead Channel Pacing Threshold Amplitude: 0.375 V
Lead Channel Pacing Threshold Amplitude: 0.875 V
Lead Channel Pacing Threshold Pulse Width: 0.4 ms
Lead Channel Pacing Threshold Pulse Width: 0.4 ms
Lead Channel Sensing Intrinsic Amplitude: 3.625 mV
Lead Channel Sensing Intrinsic Amplitude: 3.625 mV
Lead Channel Sensing Intrinsic Amplitude: 6.75 mV
Lead Channel Setting Pacing Amplitude: 1.5 V
Lead Channel Setting Pacing Amplitude: 2.5 V
Lead Channel Setting Pacing Pulse Width: 1 ms
Lead Channel Setting Sensing Sensitivity: 2.8 mV

## 2019-03-23 ENCOUNTER — Other Ambulatory Visit: Payer: Self-pay

## 2019-03-23 ENCOUNTER — Encounter: Payer: Self-pay | Admitting: Gynecology

## 2019-03-23 ENCOUNTER — Ambulatory Visit (INDEPENDENT_AMBULATORY_CARE_PROVIDER_SITE_OTHER): Payer: 59 | Admitting: Gynecology

## 2019-03-23 VITALS — BP 144/86 | Ht 64.5 in | Wt 203.0 lb

## 2019-03-23 DIAGNOSIS — Z79890 Hormone replacement therapy (postmenopausal): Secondary | ICD-10-CM

## 2019-03-23 DIAGNOSIS — N952 Postmenopausal atrophic vaginitis: Secondary | ICD-10-CM

## 2019-03-23 DIAGNOSIS — Z01419 Encounter for gynecological examination (general) (routine) without abnormal findings: Secondary | ICD-10-CM | POA: Diagnosis not present

## 2019-03-23 DIAGNOSIS — M8580 Other specified disorders of bone density and structure, unspecified site: Secondary | ICD-10-CM | POA: Diagnosis not present

## 2019-03-23 MED ORDER — ESTRADIOL 0.0375 MG/24HR TD PTTW: MEDICATED_PATCH | TRANSDERMAL | 4 refills | Status: DC

## 2019-03-23 NOTE — Patient Instructions (Addendum)
Have your blood pressure rechecked in a nonexam situation.  It was 144/86 today.  If it remains elevated you will need to follow-up with your primary provider.  Follow-up in 1 year for annual exam

## 2019-03-23 NOTE — Progress Notes (Signed)
    [name] 09/04/1955 [id number]        63 y.o.  G1P0010 for annual gynecologic exam.  Without gynecologic complaints  Past medical history,surgical history, problem list, medications, allergies, family history and social history were all reviewed and documented as reviewed in the EPIC chart.  ROS:  Performed with pertinent positives and negatives included in the history, assessment and plan.   Additional significant findings : None   Exam: Caryn Bee assistant Vitals:   03/23/19 0949  BP: (!) 144/86  Weight: 203 lb (92.1 kg)  Height: 5' 4.5" (1.638 m)   Body mass index is 34.31 kg/m.  General appearance:  Normal affect, orientation and appearance. Skin: Grossly normal HEENT: Without gross lesions.  No cervical or supraclavicular adenopathy. Thyroid normal.  Lungs:  Clear without wheezing, rales or rhonchi Cardiac: RR, without RMG Abdominal:  Soft, nontender, without masses, guarding, rebound, organomegaly or hernia Breasts:  Examined lying and sitting without masses, retractions, discharge or axillary adenopathy. Pelvic:  Ext, BUS, Vagina: With atrophic changes  Adnexa: Without masses or tenderness    Anus and perineum: Normal   Rectovaginal: Normal sphincter tone without palpated masses or tenderness.    Assessment/Plan:  63 y.o. G21P0010 female for annual gynecologic exam.   1. Postmenopausal/ERT.  Continues on Vivelle 0.0375 mg patch.  Status post [facility] 2004.  We again discussed the risks versus benefits of HRT to include thrombosis and breast cancer issue.  Patient is comfortable continuing and I refilled her x1 year. 2. Osteopenia.  DEXA 03/2018 T score -1.1 FRAX 7% / 0.5% plan repeat DEXA in several years. 3. Mammography today.  Breast exam normal today. 4. Colonoscopy in the process of being arranged. 5. Pap smear 02/2017.  No Pap smear done today.  No history of abnormal Pap smears.  Options to stop screening per current screening guidelines reviewed.  Will  readdress on an annual basis. 6. Health maintenance.  No routine lab work done as patient does this elsewhere.  Follow-up 1 year, sooner as needed.   [name] MD, 10:13 AM 03/23/2019

## 2019-03-30 ENCOUNTER — Encounter: Payer: Self-pay | Admitting: Gastroenterology

## 2019-04-09 ENCOUNTER — Other Ambulatory Visit: Payer: Self-pay

## 2019-04-09 ENCOUNTER — Ambulatory Visit (AMBULATORY_SURGERY_CENTER): Payer: 59

## 2019-04-09 VITALS — Temp 96.8°F | Ht 64.5 in | Wt 207.0 lb

## 2019-04-09 DIAGNOSIS — Z1159 Encounter for screening for other viral diseases: Secondary | ICD-10-CM

## 2019-04-09 DIAGNOSIS — Z1211 Encounter for screening for malignant neoplasm of colon: Secondary | ICD-10-CM

## 2019-04-09 MED ORDER — NA SULFATE-K SULFATE-MG SULF 17.5-3.13-1.6 GM/177ML PO SOLN: 1.0000 | Freq: Once | ORAL | 0 refills | Status: AC

## 2019-04-09 NOTE — Progress Notes (Signed)
Remote pacemaker transmission.   

## 2019-04-09 NOTE — Progress Notes (Signed)
Denies allergies to eggs or soy products. Denies complication of anesthesia or sedation. Denies use of weight loss medication. Denies use of O2.   Emmi instructions given for colonoscopy.  Covid screening 04/17/19 @ 10:50 Am. A 15.00 coupon for Suprep was given to the patient.

## 2019-04-10 ENCOUNTER — Encounter: Payer: Self-pay | Admitting: Gastroenterology

## 2019-04-17 ENCOUNTER — Ambulatory Visit (INDEPENDENT_AMBULATORY_CARE_PROVIDER_SITE_OTHER): Payer: 59

## 2019-04-17 ENCOUNTER — Other Ambulatory Visit: Payer: Self-pay | Admitting: Gastroenterology

## 2019-04-17 DIAGNOSIS — Z1159 Encounter for screening for other viral diseases: Secondary | ICD-10-CM

## 2019-04-17 LAB — SARS CORONAVIRUS 2 (TAT 6-24 HRS): SARS Coronavirus 2: NEGATIVE

## 2019-04-22 ENCOUNTER — Other Ambulatory Visit: Payer: Self-pay

## 2019-04-22 ENCOUNTER — Ambulatory Visit (AMBULATORY_SURGERY_CENTER): Payer: 59 | Admitting: Gastroenterology

## 2019-04-22 ENCOUNTER — Encounter: Payer: Self-pay | Admitting: Gastroenterology

## 2019-04-22 VITALS — BP 126/87 | HR 64 | Temp 98.5°F | Resp 17 | Ht 64.5 in | Wt 207.0 lb

## 2019-04-22 DIAGNOSIS — Z1211 Encounter for screening for malignant neoplasm of colon: Secondary | ICD-10-CM | POA: Diagnosis present

## 2019-04-22 MED ORDER — SODIUM CHLORIDE 0.9 % IV SOLN: 500.0000 mL | Freq: Once | INTRAVENOUS | Status: DC

## 2019-04-22 NOTE — Patient Instructions (Signed)
Handout on Diverticulosis given.  YOU HAD AN ENDOSCOPIC PROCEDURE TODAY AT [facility]:   Refer to the procedure report that was given to you for any specific questions about what was found during the examination.  If the procedure report does not answer your questions, please call your gastroenterologist to clarify.  If you requested that your care partner not be given the details of your procedure findings, then the procedure report has been included in a sealed envelope for you to review at your convenience later.  YOU SHOULD EXPECT: Some feelings of bloating in the abdomen. Passage of more gas than usual.  Walking can help get rid of the air that was put into your GI tract during the procedure and reduce the bloating. If you had a lower endoscopy (such as a colonoscopy or flexible sigmoidoscopy) you may notice spotting of blood in your stool or on the toilet paper. If you underwent a bowel prep for your procedure, you may not have a normal bowel movement for a few days.  Please Note:  You might notice some irritation and congestion in your nose or some drainage.  This is from the oxygen used during your procedure.  There is no need for concern and it should clear up in a day or so.  SYMPTOMS TO REPORT IMMEDIATELY:   Following lower endoscopy (colonoscopy or flexible sigmoidoscopy):  Excessive amounts of blood in the stool  Significant tenderness or worsening of abdominal pains  Swelling of the abdomen that is new, acute  Fever of 100F or higher  For urgent or emergent issues, a gastroenterologist can be reached at any hour by calling [phone].   DIET:  We do recommend a small meal at first, but then you may proceed to your regular diet.  Drink plenty of fluids but you should avoid alcoholic beverages for 24 hours.  ACTIVITY:  You should plan to take it easy for the rest of today and you should NOT DRIVE or use heavy machinery until tomorrow (because of the sedation  medicines used during the test).    FOLLOW UP: Our staff will call the number listed on your records 48-72 hours following your procedure to check on you and address any questions or concerns that you may have regarding the information given to you following your procedure. If we do not reach you, we will leave a message.  We will attempt to reach you two times.  During this call, we will ask if you have developed any symptoms of COVID 19. If you develop any symptoms (ie: fever, flu-like symptoms, shortness of breath, cough etc.) before then, please call [phone].  If you test positive for Covid 19 in the 2 weeks post procedure, please call and report this information to [place].    If any biopsies were taken you will be contacted by phone or by letter within the next 1-3 weeks.  Please call us at [phone] if you have not heard about the biopsies in 3 weeks.    SIGNATURES/CONFIDENTIALITY: You and/or your care partner have signed paperwork which will be entered into your electronic medical record.  These signatures attest to the fact that that the information above on your After Visit Summary has been reviewed and is understood.  Full responsibility of the confidentiality of this discharge information lies with you and/or your care-partner.

## 2019-04-22 NOTE — Op Note (Signed)
[facility] Patient Name: [name] Procedure Date: 04/22/2019 12:13 PM MRN: [id number] Endoscopist: Mallie Mussel L. Loletha Carrow , MD Age: 63 Referring MD:  Date of Birth: 10-31-1955 Gender: Female Account #: [id number] Procedure:                Colonoscopy Indications:              Screening for colorectal malignant neoplasm Medicines:                Monitored Anesthesia Care Procedure:                Pre-Anesthesia Assessment:                           - Prior to the procedure, a History and Physical                            was performed, and patient medications and                            allergies were reviewed. The patient's tolerance of                            previous anesthesia was also reviewed. The risks                            and benefits of the procedure and the sedation                            options and risks were discussed with the patient.                            All questions were answered, and informed consent                            was obtained. Prior Anticoagulants: The patient has                            taken no previous anticoagulant or antiplatelet                            agents except for aspirin. ASA Grade Assessment:                            III - A patient with severe systemic disease. After                            reviewing the risks and benefits, the patient was                            deemed in satisfactory condition to undergo the                            procedure.  After obtaining informed consent, the colonoscope                            was passed under direct vision. Throughout the                            procedure, the patient's blood pressure, pulse, and                            oxygen saturations were monitored continuously. The                            Colonoscope was introduced through the anus and                            advanced to the the cecum, identified by                        appendiceal orifice and ileocecal valve. The                            colonoscopy was performed without difficulty. The                            patient tolerated the procedure well. The quality                            of the bowel preparation was good after lavage. The                            ileocecal valve, appendiceal orifice, and rectum                            were photographed. Scope In: 12:22:22 PM Scope Out: 12:34:06 PM Scope Withdrawal Time: 0 hours 8 minutes 31 seconds  Total Procedure Duration: 0 hours 11 minutes 44 seconds  Findings:                 The perianal and digital rectal examinations were                            normal.                           Multiple diverticula were found from sigmoid to                            transverse colon.                           The exam was otherwise without abnormality on                            direct and retroflexion views. Complications:            No immediate complications. Estimated Blood Loss:     Estimated blood loss:  none. Impression:               - Diverticulosis from sigmoid to transverse colon.                           - The examination was otherwise normal on direct                            and retroflexion views.                           - No specimens collected. Recommendation:           - Patient has a contact number available for                            emergencies. The signs and symptoms of potential                            delayed complications were discussed with the                            patient. Return to normal activities tomorrow.                            Written discharge instructions were provided to the                            patient.                           - Resume previous diet.                           - Continue present medications.                           - Repeat colonoscopy in 10 years for screening                             purposes. Amoni Morales L. Loletha Carrow, MD 04/22/2019 12:37:03 PM This report has been signed electronically.

## 2019-04-22 NOTE — Progress Notes (Signed)
Pt's states no medical or surgical changes since previsit or office visit.  Temp taken by LC VS taken by CW  

## 2019-04-27 ENCOUNTER — Telehealth: Payer: Self-pay

## 2019-04-27 NOTE — Telephone Encounter (Signed)
  Follow up Call-  Call back number 04/22/2019  Post procedure Call Back phone  # [phone]  Permission to leave phone message Yes  Some recent data might be hidden     Patient questions:  Do you have a fever, pain , or abdominal swelling? No. Pain Score  0 *  Have you tolerated food without any problems? Yes.    Have you been able to return to your normal activities? Yes.    Do you have any questions about your discharge instructions: Diet   No. Medications  No. Follow up visit  No.  Do you have questions or concerns about your Care? No.  Actions: * If pain score is 4 or above: No action needed, pain <4. 1. Have you developed a fever since your procedure? no  2.   Have you had an respiratory symptoms (SOB or cough) since your procedure? no  3.   Have you tested positive for COVID 19 since your procedure no  4.   Have you had any family members/close contacts diagnosed with the COVID 19 since your procedure?  no   If yes to any of these questions please route to Joylene John, RN and Alphonsa Gin, Therapist, sports.

## 2019-05-05 ENCOUNTER — Encounter: Payer: Self-pay | Admitting: Women's Health

## 2019-05-05 ENCOUNTER — Ambulatory Visit: Payer: 59 | Admitting: Women's Health

## 2019-05-05 ENCOUNTER — Other Ambulatory Visit: Payer: Self-pay

## 2019-05-05 VITALS — BP 140/90

## 2019-05-05 DIAGNOSIS — R300 Dysuria: Secondary | ICD-10-CM

## 2019-05-05 LAB — URINALYSIS, ROUTINE W REFLEX MICROSCOPIC: Hyaline Cast: NONE SEEN /LPF

## 2019-05-05 MED ORDER — SULFAMETHOXAZOLE-TRIMETHOPRIM 800-160 MG PO TABS: 1.0000 | ORAL_TABLET | Freq: Two times a day (BID) | ORAL | 0 refills | Status: DC

## 2019-05-05 NOTE — Progress Notes (Deleted)
64 yo MWF G1P0 presents with dysuria, increased urinary frequency, and hematuria x 1 day. Pt reports "feels similar to past UTI's". Sexually active with marital partner. Denies vaginal discharge and bleeding. Retired Engineer, structural, now Photographer.   Exam:  General appearance: normal affect, orientation, and appearance. Pelvic:   Ext, BUS, Vagina: wnl  Adnexa: without masses or tenderness   Anus and perineum: normal  UA:

## 2019-05-05 NOTE — Progress Notes (Addendum)
64 yo MWF G1P0 presents with complaint of dysuria, increased urinary frequency, and hematuria x 1 day. Pt reports "feels similar to past UTI's". Took AZO at home. Sexually active with marital partner. Denies vaginal discharge, abdominal/back pain, fever, or change in routine. Retired Engineer, structural, now Photographer. Medical problems include diabetes, on several medications to control, HTN, CVD. TVH and currently on Vivelle 0.0375 patch.  Exam: appears well . Pelvic:  Ext, BUS, Vagina: wnl, speculum exam, no discharge, erythema, odor     UA: AZO, urine orange  WBC: 10-20  RBC: 3-10  Bacteria: moderate   Urinary Tract Infection  Plan: Rx for Septra 800-160 mg po BID. Advised to drink fluids, urinate after intercourse, wipe front to back. Pt to call back for new or worsening symptoms. Urine culture pending.

## 2019-05-05 NOTE — Patient Instructions (Signed)

## 2019-06-15 ENCOUNTER — Ambulatory Visit (INDEPENDENT_AMBULATORY_CARE_PROVIDER_SITE_OTHER): Payer: 59 | Admitting: *Deleted

## 2019-06-15 DIAGNOSIS — I442 Atrioventricular block, complete: Secondary | ICD-10-CM

## 2019-06-15 LAB — CUP PACEART REMOTE DEVICE CHECK
Battery Remaining Longevity: 92 mo
Battery Voltage: 3 V
Brady Statistic AP VP Percent: 0.08 %
Brady Statistic AP VS Percent: 0 %
Brady Statistic AS VP Percent: 99.92 %
Brady Statistic AS VS Percent: 0.01 %
Brady Statistic RA Percent Paced: 0.08 %
Brady Statistic RV Percent Paced: 99.99 %
Date Time Interrogation Session: 20210215011936
Implantable Lead Implant Date: 20200511
Implantable Lead Implant Date: 20200511
Implantable Lead Location: 753859
Implantable Lead Location: 753860
Implantable Lead Model: 3830
Implantable Lead Model: 5076
Implantable Pulse Generator Implant Date: 20200511
Lead Channel Impedance Value: 285 Ohm
Lead Channel Impedance Value: 323 Ohm
Lead Channel Impedance Value: 399 Ohm
Lead Channel Impedance Value: 437 Ohm
Lead Channel Pacing Threshold Amplitude: 0.5 V
Lead Channel Pacing Threshold Amplitude: 0.625 V
Lead Channel Pacing Threshold Pulse Width: 0.4 ms
Lead Channel Pacing Threshold Pulse Width: 0.4 ms
Lead Channel Sensing Intrinsic Amplitude: 4.625 mV
Lead Channel Sensing Intrinsic Amplitude: 4.625 mV
Lead Channel Sensing Intrinsic Amplitude: 6.75 mV
Lead Channel Setting Pacing Amplitude: 1.5 V
Lead Channel Setting Pacing Amplitude: 2.5 V
Lead Channel Setting Pacing Pulse Width: 1 ms
Lead Channel Setting Sensing Sensitivity: 2.8 mV

## 2019-06-15 NOTE — Progress Notes (Signed)
PPM Remote  

## 2019-07-23 ENCOUNTER — Ambulatory Visit: Payer: 59 | Attending: Internal Medicine

## 2019-07-23 DIAGNOSIS — Z23 Encounter for immunization: Secondary | ICD-10-CM

## 2019-07-23 NOTE — Progress Notes (Signed)
   Covid-19 Vaccination Clinic  Name:  [name]    MRN: [id number] DOB: [birth year]  07/23/2019  [name] was observed post Covid-19 immunization for 15 minutes without incident. She was provided with Vaccine Information Sheet and instruction to access the V-Safe system.   [name] was instructed to call 911 with any severe reactions post vaccine: Marland Kitchen Difficulty breathing  . Swelling of face and throat  . A fast heartbeat  . A bad rash all over body  . Dizziness and weakness   Immunizations Administered    Name Date Dose VIS Date Route   Pfizer COVID-19 Vaccine 07/23/2019 10:42 AM 0.3 mL 04/10/2019 Intramuscular   Manufacturer: [place]   Lot: [id number]   [place]: [id number]

## 2019-08-17 ENCOUNTER — Ambulatory Visit: Payer: 59 | Attending: Internal Medicine

## 2019-08-17 DIAGNOSIS — Z23 Encounter for immunization: Secondary | ICD-10-CM

## 2019-08-17 NOTE — Progress Notes (Signed)
   Covid-19 Vaccination Clinic  Name:  [name]    MRN: [id number] DOB: 04/20/1956  08/17/2019  [name] was observed post Covid-19 immunization for 15 minutes without incident. She was provided with Vaccine Information Sheet and instruction to access the V-Safe system.   [name] was instructed to call 911 with any severe reactions post vaccine: Marland Kitchen Difficulty breathing  . Swelling of face and throat  . A fast heartbeat  . A bad rash all over body  . Dizziness and weakness   Immunizations Administered    Name Date Dose VIS Date Route   Pfizer COVID-19 Vaccine 08/17/2019 10:20 AM 0.3 mL 06/24/2018 Intramuscular   Manufacturer: [place]   Lot: [id number]   [place]: [id number]

## 2019-09-14 ENCOUNTER — Ambulatory Visit (INDEPENDENT_AMBULATORY_CARE_PROVIDER_SITE_OTHER): Payer: 59 | Admitting: *Deleted

## 2019-09-14 DIAGNOSIS — I442 Atrioventricular block, complete: Secondary | ICD-10-CM | POA: Diagnosis not present

## 2019-09-14 LAB — CUP PACEART REMOTE DEVICE CHECK
Battery Remaining Longevity: 87 mo
Battery Voltage: 2.99 V
Brady Statistic AP VP Percent: 0.37 %
Brady Statistic AP VS Percent: 0 %
Brady Statistic AS VP Percent: 99.62 %
Brady Statistic AS VS Percent: 0.01 %
Brady Statistic RA Percent Paced: 0.37 %
Brady Statistic RV Percent Paced: 99.99 %
Date Time Interrogation Session: 20210516194618
Implantable Lead Implant Date: 20200511
Implantable Lead Implant Date: 20200511
Implantable Lead Location: 753859
Implantable Lead Location: 753860
Implantable Lead Model: 3830
Implantable Lead Model: 5076
Implantable Pulse Generator Implant Date: 20200511
Lead Channel Impedance Value: 285 Ohm
Lead Channel Impedance Value: 323 Ohm
Lead Channel Impedance Value: 399 Ohm
Lead Channel Impedance Value: 399 Ohm
Lead Channel Pacing Threshold Amplitude: 0.5 V
Lead Channel Pacing Threshold Amplitude: 0.875 V
Lead Channel Pacing Threshold Pulse Width: 0.4 ms
Lead Channel Pacing Threshold Pulse Width: 0.4 ms
Lead Channel Sensing Intrinsic Amplitude: 3.875 mV
Lead Channel Sensing Intrinsic Amplitude: 3.875 mV
Lead Channel Sensing Intrinsic Amplitude: 6.75 mV
Lead Channel Setting Pacing Amplitude: 1.5 V
Lead Channel Setting Pacing Amplitude: 2.5 V
Lead Channel Setting Pacing Pulse Width: 1 ms
Lead Channel Setting Sensing Sensitivity: 2.8 mV

## 2019-09-14 NOTE — Progress Notes (Signed)
Remote pacemaker transmission.   

## 2019-10-20 ENCOUNTER — Other Ambulatory Visit: Payer: Self-pay

## 2019-10-20 ENCOUNTER — Ambulatory Visit: Payer: 59 | Admitting: Cardiology

## 2019-10-20 ENCOUNTER — Encounter: Payer: Self-pay | Admitting: Cardiology

## 2019-10-20 VITALS — BP 130/76 | HR 76 | Ht 64.0 in | Wt 204.0 lb

## 2019-10-20 DIAGNOSIS — I442 Atrioventricular block, complete: Secondary | ICD-10-CM | POA: Diagnosis not present

## 2019-10-20 LAB — CUP PACEART INCLINIC DEVICE CHECK
Battery Remaining Longevity: 85 mo
Battery Voltage: 2.99 V
Brady Statistic AP VP Percent: 0.2 %
Brady Statistic AP VS Percent: 0 %
Brady Statistic AS VP Percent: 99.8 %
Brady Statistic AS VS Percent: 0.01 %
Brady Statistic RA Percent Paced: 0.2 %
Brady Statistic RV Percent Paced: 99.99 %
Date Time Interrogation Session: 20210622142600
Implantable Lead Implant Date: 20200511
Implantable Lead Implant Date: 20200511
Implantable Lead Location: 753859
Implantable Lead Location: 753860
Implantable Lead Model: 3830
Implantable Lead Model: 5076
Implantable Pulse Generator Implant Date: 20200511
Lead Channel Impedance Value: 285 Ohm
Lead Channel Impedance Value: 361 Ohm
Lead Channel Impedance Value: 399 Ohm
Lead Channel Impedance Value: 437 Ohm
Lead Channel Pacing Threshold Amplitude: 0.5 V
Lead Channel Pacing Threshold Amplitude: 1 V
Lead Channel Pacing Threshold Pulse Width: 0.4 ms
Lead Channel Pacing Threshold Pulse Width: 1 ms
Lead Channel Sensing Intrinsic Amplitude: 6.1 mV
Lead Channel Sensing Intrinsic Amplitude: 7.6 mV
Lead Channel Setting Pacing Amplitude: 1.5 V
Lead Channel Setting Pacing Amplitude: 2.5 V
Lead Channel Setting Pacing Pulse Width: 1 ms
Lead Channel Setting Sensing Sensitivity: 2.8 mV

## 2019-10-20 NOTE — Progress Notes (Signed)
Electrophysiology Office Note   Date:  10/20/2019   ID:  [name], [name], [name], MRN [id number]  PCP:  Haywood Pao, MD  Cardiologist:  Hochrein Primary Electrophysiologist:  Landree Fernholz Meredith Leeds, MD    No chief complaint on file.    History of Present Illness: [name] is a 64 y.o. female who is being seen today for the evaluation of heart block at the request of Tisovec, Fransico Him, MD. Presenting today for electrophysiology evaluation.  She has a history of diabetes and hypertension.  She presented to the hospital May 2020 with episodes of heart block.  She is now status post Medtronic dual-chamber pacemaker implanted 09/08/2018.  Today, denies symptoms of palpitations, chest pain, shortness of breath, orthopnea, PND, lower extremity edema, claudication, dizziness, presyncope, syncope, bleeding, or neurologic sequela. The patient is tolerating medications without difficulties.  Overall she is doing well.  She has no chest pain or shortness of breath.  She has quite a bit more energy since her pacemaker was implanted.  Past Medical History:  Diagnosis Date  . Allergy   . Arthritis   . Cataract   . Diabetes mellitus   . Hyperlipidemia   . Hypertension   . Osteopenia Nunez/2019   T score -1.1 FRAX 7% / 0.5%   Past Surgical History:  Procedure Laterality Date  . COLONOSCOPY    . DILATION AND CURETTAGE OF UTERUS    . HYSTEROSCOPY     AND D&C/FOR ENDO POLYP  . LASIK    . PACEMAKER IMPLANT N/A 09/08/2018   Procedure: PACEMAKER IMPLANT;  Surgeon: Constance Haw, MD;  Location: [place] [facility];  Service: Cardiovascular;  Laterality: N/A;  . TUBAL LIGATION  1999  . VAGINAL HYSTERECTOMY  2004     Current Outpatient Medications  Medication Sig Dispense Refill  . aspirin EC 81 MG tablet Take 81 mg by mouth every evening.    Marland Kitchen atorvastatin (LIPITOR) 10 MG tablet Take 5 mg by mouth daily.    Marland Kitchen Bioflavonoid Products (ESTER-C) 500-550 MG TABS Take 1 tablet by  mouth daily.    . carvedilol (COREG) Nunez.5 MG tablet Take Nunez.5 mg by mouth 2 (two) times daily with a meal.    . cetirizine (ZYRTEC) 10 MG tablet Take 10 mg by mouth every evening.    . cholecalciferol (VITAMIN D3) 25 MCG (1000 UT) tablet Take 1,000 Units by mouth daily.    Marland Kitchen CINNAMON PO Take 1,500 mg by mouth daily. Cinnamon Complex    . estradiol (VIVELLE-DOT) 0.0375 MG/24HR PLACE 1 PATCH ONTO THE SKIN2 TIMES A WEEK 24 patch 4  . JENTADUETO 2.08-998 MG TABS Take 1 tablet by mouth 2 (two) times a day.    . lisinopril (PRINIVIL,ZESTRIL) 40 MG tablet Take 40 mg by mouth daily.      . Multiple Vitamin (MULTIVITAMIN WITH MINERALS) TABS tablet Take 1 tablet by mouth daily. Women's One-A-Day Multivitamin    . Multiple Vitamins-Minerals (ZINC PO) Take 40 mg by mouth daily.     . naproxen sodium (ALEVE) 220 MG tablet Take 220 mg by mouth 2 (two) times daily as needed (pain.).    Marland Kitchen Polyethyl Glycol-Propyl Glycol (SYSTANE ULTRA) 0.4-0.3 % SOLN Place 1 drop into both eyes 2 (two) times a day.     No current facility-administered medications for this visit.    Allergies:   Penicillins   Social History:  The patient  reports that she has never smoked. She has never used smokeless  tobacco. She reports current alcohol use of about 1.0 standard drink of alcohol per week. She reports that she does not use drugs.   Family History:  The patient's family history includes Diabetes in her brother, father, mother, and paternal aunt; Heart disease (age of onset: 35) in her mother; Hypertension in her mother.   ROS:  Please see the history of present illness.   Otherwise, review of systems is positive for none.   All other systems are reviewed and negative.   PHYSICAL EXAM: VS:  BP 130/76   Pulse 76   Ht 5\' 4"  (1.626 m)   Wt 204 lb (92.5 kg)   SpO2 97%   BMI 35.02 kg/m  , BMI Body mass index is 35.02 kg/m. GEN: Well nourished, well developed, in no acute distress  HEENT: normal  Neck: no JVD, carotid  bruits, or masses Cardiac: RRR; no murmurs, rubs, or gallops,no edema  Respiratory:  clear to auscultation bilaterally, normal work of breathing GI: soft, nontender, nondistended, + BS MS: no deformity or atrophy  Skin: warm and dry, device site well healed Neuro:  Strength and sensation are intact Psych: euthymic mood, full affect  EKG:  EKG is ordered today. Personal review of the ekg ordered shows sinus rhythm, ventricular paced  Personal review of the device interrogation today. Results in [place]: No results found for requested labs within last 8760 hours.    Lipid Panel  No results found for: CHOL, TRIG, HDL, CHOLHDL, VLDL, LDLCALC, LDLDIRECT   Wt Readings from Last 3 Encounters:  10/20/19 204 lb (92.5 kg)  Nunez/23/20 207 lb (93.9 kg)  Nunez/10/20 207 lb (93.9 kg)      Other studies Reviewed: Additional studies/ records that were reviewed today include: TTE 09/08/18  Review of the above records today demonstrates:   1. The left ventricle has hyperdynamic systolic function, with an ejection fraction of >65%. Left ventricular diastolic Doppler parameters are consistent with impaired relaxation.  2. The right ventricle has normal systolc function. The cavity was normal. There is no increase in right ventricular wall thickness.  3. No stenosis of the aortic valve.  4. The aortic root and ascending aorta are normal in size and structure.   ASSESSMENT AND PLAN:  1.  Complete heart block: Status post Medtronic dual-chamber pacemaker implanted 09/08/2018.  Device functioning appropriately.  No changes.   2.  Hypertension: Currently well controlled  Current medicines are reviewed at length with the patient today.   The patient does not have concerns regarding her medicines.  The following changes were made today: None  Labs/ tests ordered today include:  Orders Placed This Encounter  Procedures  . EKG Nunez-Lead     Disposition:   FU with Oza Oberle Nunez  months  Signed, Inez Stantz Meredith Leeds, MD  10/20/2019 2:55 PM     [facility] [address] [place] [address] [phone] (office) [phone] (fax)

## 2019-10-20 NOTE — Patient Instructions (Signed)
Medication Instructions:  Your physician recommends that you continue on your current medications as directed. Please refer to the Current Medication list given to you today.  *If you need a refill on your cardiac medications before your next appointment, please call your pharmacy*   Lab Work: None ordered If you have labs (blood work) drawn today and your tests are completely normal, you will receive your results only by: . MyChart Message (if you have MyChart) OR . A paper copy in the mail If you have any lab test that is abnormal or we need to change your treatment, we will call you to review the results.   Testing/Procedures: None ordered   Follow-Up: At [facility], you and your health needs are our priority.  As part of our continuing mission to provide you with exceptional heart care, we have created designated Provider Care Teams.  These Care Teams include your primary Cardiologist (physician) and Advanced Practice Providers (APPs -  Physician Assistants and Nurse Practitioners) who all work together to provide you with the care you need, when you need it.  We recommend signing up for the patient portal called "MyChart".  Sign up information is provided on this After Visit Summary.  MyChart is used to connect with patients for Virtual Visits (Telemedicine).  Patients are able to view lab/test results, encounter notes, upcoming appointments, etc.  Non-urgent messages can be sent to your provider as well.   To learn more about what you can do with MyChart, go to [email or web address].    Your next appointment:   1 year(s)  The format for your next appointment:   In Person  Provider:   Will Camnitz, MD   Thank you for choosing [facility]!!   Avelynn Sellin, RN [phone]    Other Instructions    

## 2019-12-14 ENCOUNTER — Ambulatory Visit (INDEPENDENT_AMBULATORY_CARE_PROVIDER_SITE_OTHER): Payer: 59 | Admitting: *Deleted

## 2019-12-14 DIAGNOSIS — I442 Atrioventricular block, complete: Secondary | ICD-10-CM | POA: Diagnosis not present

## 2019-12-14 LAB — CUP PACEART REMOTE DEVICE CHECK
Battery Remaining Longevity: 82 mo
Battery Voltage: 2.99 V
Brady Statistic AP VP Percent: 0.1 %
Brady Statistic AP VS Percent: 0 %
Brady Statistic AS VP Percent: 99.89 %
Brady Statistic AS VS Percent: 0.01 %
Brady Statistic RA Percent Paced: 0.1 %
Brady Statistic RV Percent Paced: 99.99 %
Date Time Interrogation Session: 20210815025158
Implantable Lead Implant Date: 20200511
Implantable Lead Implant Date: 20200511
Implantable Lead Location: 753859
Implantable Lead Location: 753860
Implantable Lead Model: 3830
Implantable Lead Model: 5076
Implantable Pulse Generator Implant Date: 20200511
Lead Channel Impedance Value: 285 Ohm
Lead Channel Impedance Value: 323 Ohm
Lead Channel Impedance Value: 380 Ohm
Lead Channel Impedance Value: 437 Ohm
Lead Channel Pacing Threshold Amplitude: 0.5 V
Lead Channel Pacing Threshold Amplitude: 1 V
Lead Channel Pacing Threshold Pulse Width: 0.4 ms
Lead Channel Pacing Threshold Pulse Width: 0.4 ms
Lead Channel Sensing Intrinsic Amplitude: 3.5 mV
Lead Channel Sensing Intrinsic Amplitude: 3.5 mV
Lead Channel Sensing Intrinsic Amplitude: 6.125 mV
Lead Channel Setting Pacing Amplitude: 1.5 V
Lead Channel Setting Pacing Amplitude: 2.5 V
Lead Channel Setting Pacing Pulse Width: 1 ms
Lead Channel Setting Sensing Sensitivity: 2.8 mV

## 2019-12-15 NOTE — Progress Notes (Signed)
Remote pacemaker transmission.   

## 2020-01-07 IMAGING — CR CHEST - 2 VIEW
2 series · 2 of 2 positions shown · non-contrast
Comparison: None.

CLINICAL DATA: Pacemaker placement

EXAM:
CHEST - 2 VIEW

[w chest pa]
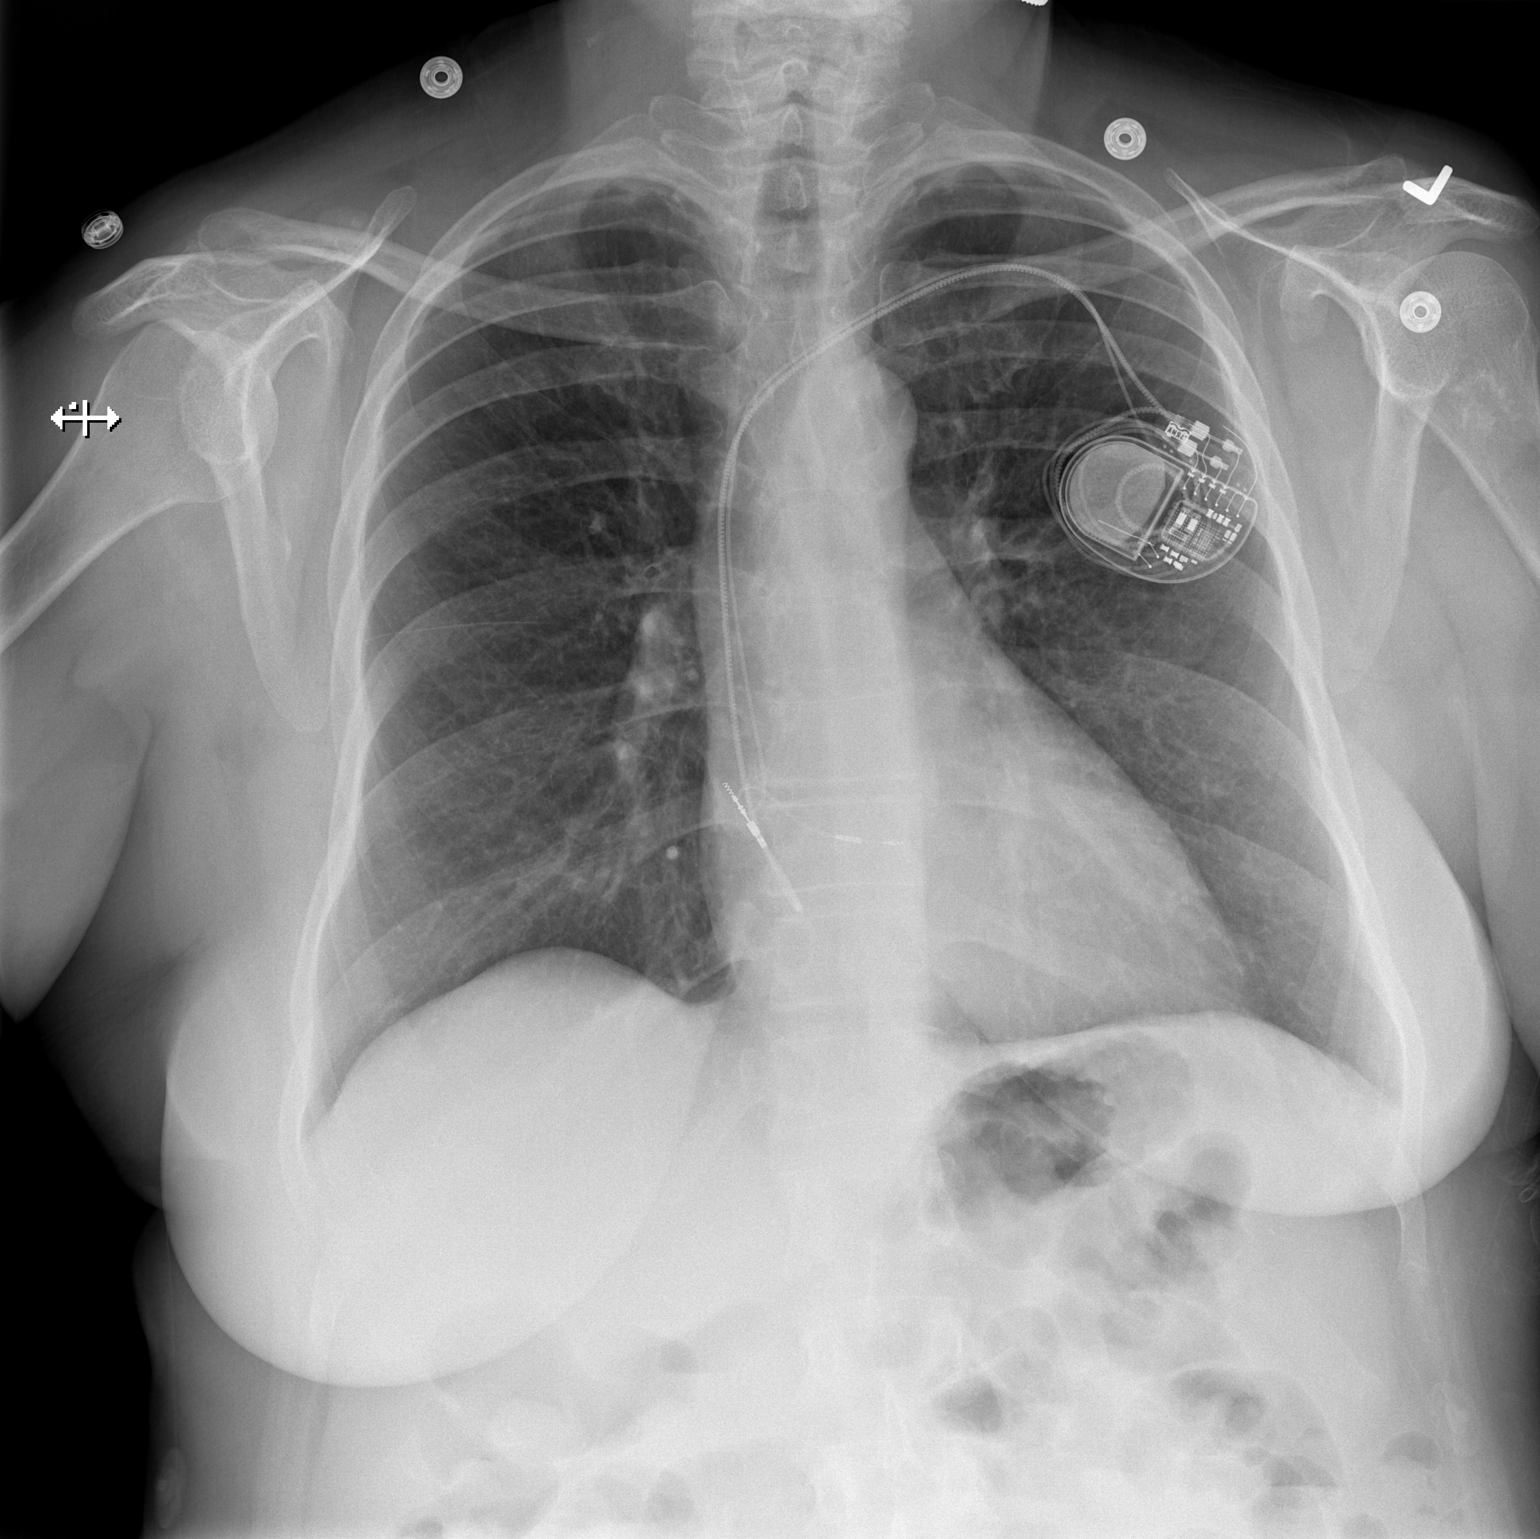

[w chest lat]
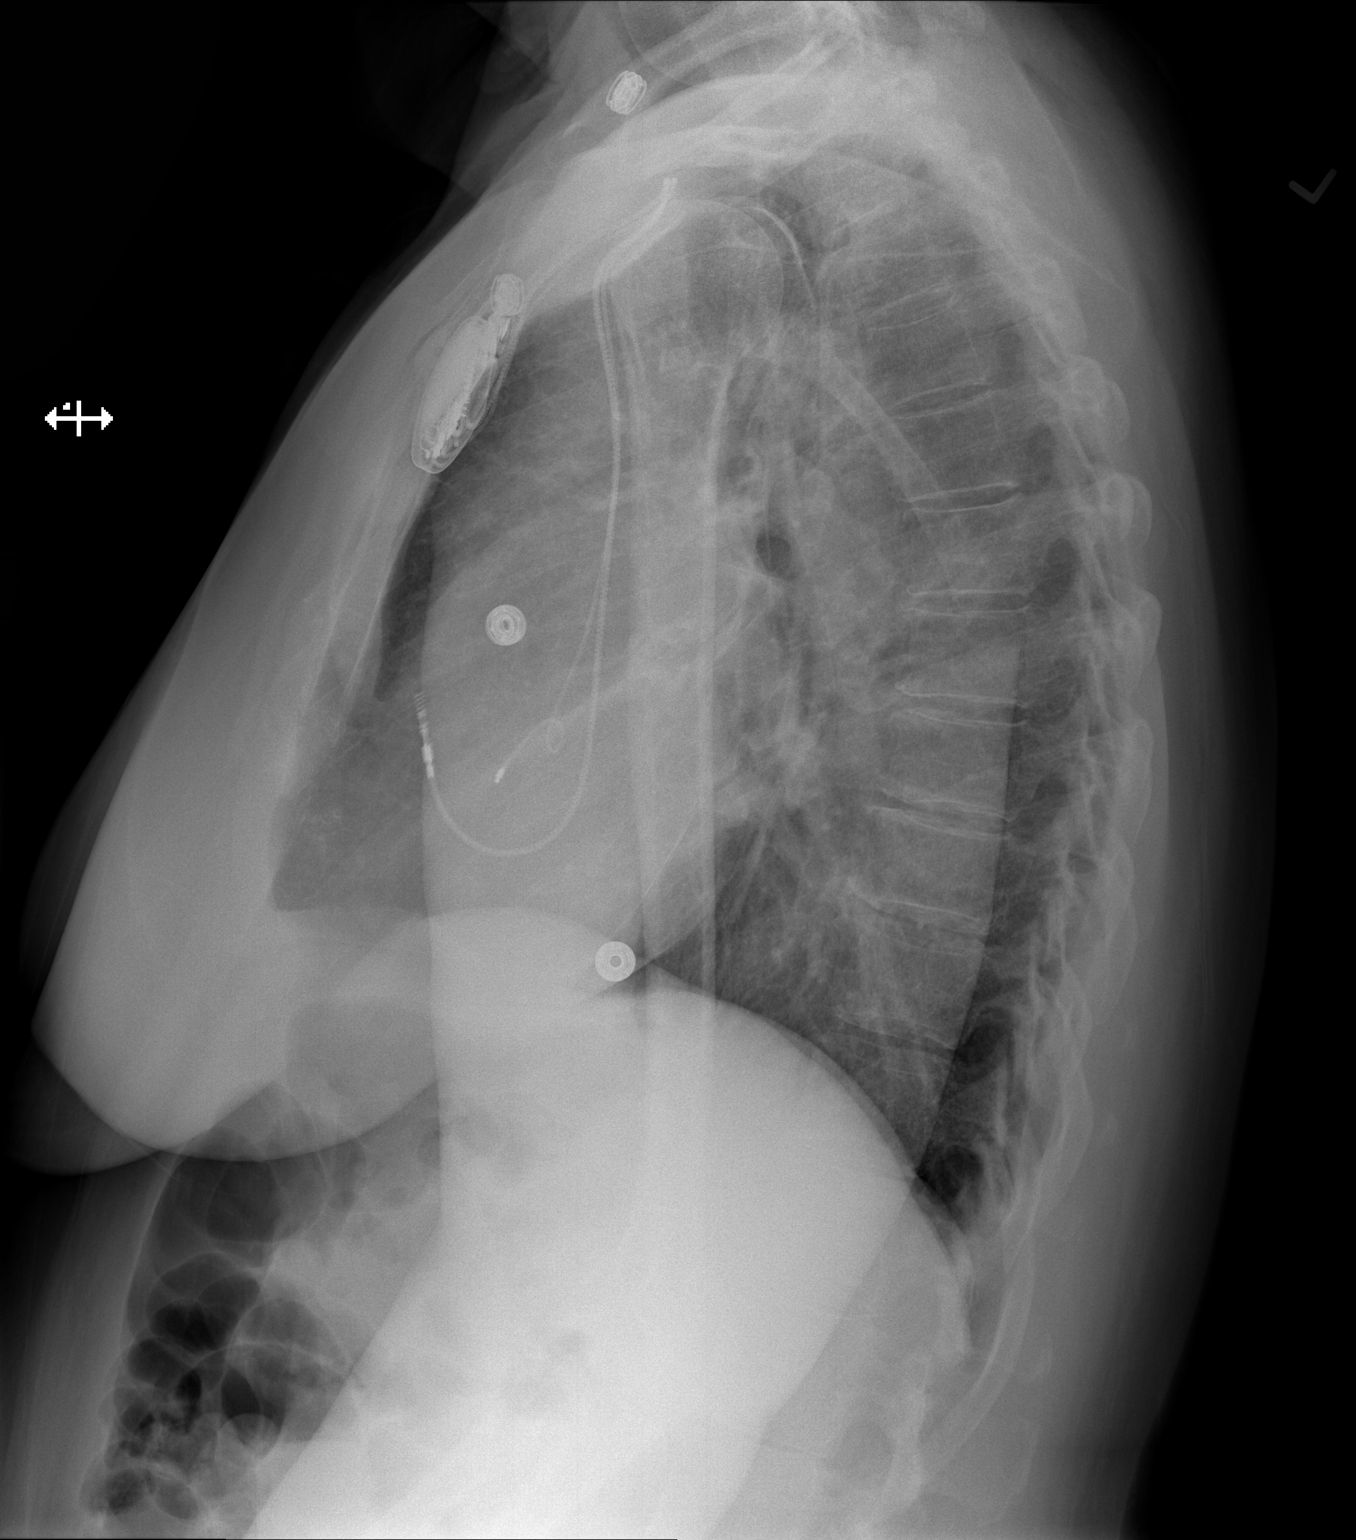

[2 of 2 positions shown; findings below may reference images not displayed]

FINDINGS: There is a pacemaker on the left with lead tips attached to the
right atrium. No edema or consolidation. Heart size and pulmonary
vascular normal. No adenopathy. No bone lesions.
IMPRESSION: Pacemaker with leads as described. No pneumothorax. Heart size
normal.

## 2020-03-13 LAB — CUP PACEART REMOTE DEVICE CHECK
Battery Remaining Longevity: 79 mo
Battery Voltage: 2.98 V
Brady Statistic AP VP Percent: 0.2 %
Brady Statistic AP VS Percent: 0 %
Brady Statistic AS VP Percent: 99.8 %
Brady Statistic AS VS Percent: 0.01 %
Brady Statistic RA Percent Paced: 0.2 %
Brady Statistic RV Percent Paced: 99.99 %
Date Time Interrogation Session: 20211113181446
Implantable Lead Implant Date: 20200511
Implantable Lead Implant Date: 20200511
Implantable Lead Location: 753859
Implantable Lead Location: 753860
Implantable Lead Model: 3830
Implantable Lead Model: 5076
Implantable Pulse Generator Implant Date: 20200511
Lead Channel Impedance Value: 285 Ohm
Lead Channel Impedance Value: 323 Ohm
Lead Channel Impedance Value: 380 Ohm
Lead Channel Impedance Value: 437 Ohm
Lead Channel Pacing Threshold Amplitude: 0.5 V
Lead Channel Pacing Threshold Amplitude: 1.25 V
Lead Channel Pacing Threshold Pulse Width: 0.4 ms
Lead Channel Pacing Threshold Pulse Width: 0.4 ms
Lead Channel Sensing Intrinsic Amplitude: 3.375 mV
Lead Channel Sensing Intrinsic Amplitude: 3.375 mV
Lead Channel Sensing Intrinsic Amplitude: 6.125 mV
Lead Channel Setting Pacing Amplitude: 1.5 V
Lead Channel Setting Pacing Amplitude: 2.5 V
Lead Channel Setting Pacing Pulse Width: 1 ms
Lead Channel Setting Sensing Sensitivity: 2.8 mV

## 2020-03-14 ENCOUNTER — Ambulatory Visit (INDEPENDENT_AMBULATORY_CARE_PROVIDER_SITE_OTHER): Payer: 59

## 2020-03-14 DIAGNOSIS — I442 Atrioventricular block, complete: Secondary | ICD-10-CM

## 2020-03-15 NOTE — Progress Notes (Signed)
Remote pacemaker transmission.   

## 2020-03-23 ENCOUNTER — Other Ambulatory Visit: Payer: Self-pay

## 2020-03-23 ENCOUNTER — Encounter: Payer: Self-pay | Admitting: Obstetrics and Gynecology

## 2020-03-23 ENCOUNTER — Ambulatory Visit: Payer: 59 | Admitting: Obstetrics and Gynecology

## 2020-03-23 VITALS — BP 130/82 | Ht 65.0 in | Wt 204.0 lb

## 2020-03-23 DIAGNOSIS — M8580 Other specified disorders of bone density and structure, unspecified site: Secondary | ICD-10-CM

## 2020-03-23 DIAGNOSIS — Z01419 Encounter for gynecological examination (general) (routine) without abnormal findings: Secondary | ICD-10-CM | POA: Diagnosis not present

## 2020-03-23 DIAGNOSIS — Z79890 Hormone replacement therapy (postmenopausal): Secondary | ICD-10-CM | POA: Diagnosis not present

## 2020-03-23 DIAGNOSIS — Z1272 Encounter for screening for malignant neoplasm of vagina: Secondary | ICD-10-CM | POA: Diagnosis not present

## 2020-03-23 MED ORDER — ESTRADIOL 0.0375 MG/24HR TD PTTW: MEDICATED_PATCH | TRANSDERMAL | 4 refills | Status: DC

## 2020-03-23 NOTE — Addendum Note (Signed)
Addended by: Nelva Nay on: 03/23/2020 10:40 AM   Modules accepted: Orders

## 2020-03-23 NOTE — Progress Notes (Signed)
[name] 1955-11-14 [id number]  SUBJECTIVE:  64 y.o. G1P0010 female for annual routine gynecologic exam and Pap smear. She has no gynecologic concerns.  Current Outpatient Medications  Medication Sig Dispense Refill  . aspirin EC 81 MG tablet Take 81 mg by mouth every evening.    Marland Kitchen atorvastatin (LIPITOR) 10 MG tablet Take 5 mg by mouth daily.    Marland Kitchen Bioflavonoid Products (ESTER-C) 500-550 MG TABS Take 1 tablet by mouth daily.    . carvedilol (COREG) 12.5 MG tablet Take 12.5 mg by mouth 2 (two) times daily with a meal.    . cetirizine (ZYRTEC) 10 MG tablet Take 10 mg by mouth every evening.    . cholecalciferol (VITAMIN D3) 25 MCG (1000 UT) tablet Take 1,000 Units by mouth daily.    Marland Kitchen CINNAMON PO Take 1,500 mg by mouth daily. Cinnamon Complex    . estradiol (VIVELLE-DOT) 0.0375 MG/24HR PLACE 1 PATCH ONTO THE SKIN2 TIMES A WEEK 24 patch 4  . JENTADUETO 2.08-998 MG TABS Take 1 tablet by mouth 2 (two) times a day.    . lisinopril (PRINIVIL,ZESTRIL) 40 MG tablet Take 40 mg by mouth daily.      . Multiple Vitamin (MULTIVITAMIN WITH MINERALS) TABS tablet Take 1 tablet by mouth daily. Women's One-A-Day Multivitamin    . Multiple Vitamins-Minerals (ZINC PO) Take 40 mg by mouth daily.     . naproxen sodium (ALEVE) 220 MG tablet Take 220 mg by mouth 2 (two) times daily as needed (pain.).    Marland Kitchen Polyethyl Glycol-Propyl Glycol (SYSTANE ULTRA) 0.4-0.3 % SOLN Place 1 drop into both eyes 2 (two) times a day.     No current facility-administered medications for this visit.   Allergies: Penicillins  No LMP recorded. Patient has had a hysterectomy.  Past medical history,surgical history, problem list, medications, allergies, family history and social history were all reviewed and documented as reviewed in the EPIC chart.  ROS: Pertinent positives and negatives as reviewed in HPI   OBJECTIVE:  BP 130/82   Ht 5\' 5"  (1.651 m)   Wt 204 lb (92.5 kg)   BMI 33.95 kg/m  The patient appears well, alert,  oriented, in no distress. ENT normal.  Neck supple. No cervical or supraclavicular adenopathy or thyromegaly.  Lungs are clear, good air entry, no wheezes, rhonchi or rales. S1 and S2 normal, no murmurs, regular rate and rhythm.  Abdomen soft without tenderness, guarding, mass or organomegaly.  Neurological is normal, no focal findings.  BREAST EXAM: breasts appear normal, no suspicious masses, no skin or nipple changes or axillary nodes  PELVIC EXAM: VULVA: normal appearing vulva with atrophic changes, no masses, tenderness or lesions, VAGINA: normal appearing vagina with atrophic changes, normal color and discharge, no lesions, CERVIX: surgically absent, UTERUS: surgically absent, vaginal cuff normal, ADNEXA: no masses, nontender, PAP: Pap smear done today, thin-prep method, vaginal cuff  Chaperone: Caryn Bee present during the examination  ASSESSMENT:  64 y.o. G1P0010 here for annual gynecologic exam  PLAN:   1. Postmenopausal/HRT. Prior TVH in 2004.  Has been on the Vivelle patch 0.0375 mg dose twice weekly and would like to continue.  Aware of the risks of HRT to include thrombotic diseases and the breast cancer issue.  She is comfortable continuing.  Refill x1 year is provided.  Discussed consideration of weaning down to a lower dose in the next year or two. 2. Pap smear 02/2017.  No significant history of abnormal Pap smears.  Pap smear screening guidelines  reviewed, we went ahead and collected a cytology only Pap smear of the vaginal cuff today. 3. Mammogram 03/2019.  Normal breast exam today.  She is reminded to schedule an annual mammogram this year. 4. Colonoscopy 2020.  She will follow up at the recommended interval.  5.  Osteopenia.  DEXA 03/2018 with T score -1.1, FRAX 7% / 0.5%.  Plan is to repeat the DEXA in another year or two. 6. Health maintenance.  No labs today as she normally has these completed elsewhere.  Return annually or sooner, prn.  Joseph Pierini  MD 03/23/20

## 2020-03-28 LAB — PAP IG W/ RFLX HPV ASCU

## 2020-06-12 LAB — CUP PACEART REMOTE DEVICE CHECK
Battery Remaining Longevity: 78 mo
Battery Voltage: 2.98 V
Brady Statistic AP VP Percent: 0.35 %
Brady Statistic AP VS Percent: 0 %
Brady Statistic AS VP Percent: 99.64 %
Brady Statistic AS VS Percent: 0.01 %
Brady Statistic RA Percent Paced: 0.35 %
Brady Statistic RV Percent Paced: 99.99 %
Date Time Interrogation Session: 20220211180700
Implantable Lead Implant Date: 20200511
Implantable Lead Implant Date: 20200511
Implantable Lead Location: 753859
Implantable Lead Location: 753860
Implantable Lead Model: 3830
Implantable Lead Model: 5076
Implantable Pulse Generator Implant Date: 20200511
Lead Channel Impedance Value: 266 Ohm
Lead Channel Impedance Value: 323 Ohm
Lead Channel Impedance Value: 399 Ohm
Lead Channel Impedance Value: 437 Ohm
Lead Channel Pacing Threshold Amplitude: 0.5 V
Lead Channel Pacing Threshold Amplitude: 1.75 V
Lead Channel Pacing Threshold Pulse Width: 0.4 ms
Lead Channel Pacing Threshold Pulse Width: 0.4 ms
Lead Channel Sensing Intrinsic Amplitude: 4 mV
Lead Channel Sensing Intrinsic Amplitude: 4 mV
Lead Channel Sensing Intrinsic Amplitude: 6.125 mV
Lead Channel Setting Pacing Amplitude: 1.5 V
Lead Channel Setting Pacing Amplitude: 2.5 V
Lead Channel Setting Pacing Pulse Width: 1 ms
Lead Channel Setting Sensing Sensitivity: 2.8 mV

## 2020-06-13 ENCOUNTER — Ambulatory Visit (INDEPENDENT_AMBULATORY_CARE_PROVIDER_SITE_OTHER): Payer: 59

## 2020-06-13 DIAGNOSIS — I442 Atrioventricular block, complete: Secondary | ICD-10-CM

## 2020-06-16 NOTE — Progress Notes (Signed)
Remote pacemaker transmission.   

## 2020-06-30 ENCOUNTER — Telehealth: Payer: Self-pay | Admitting: Cardiology

## 2020-06-30 NOTE — Telephone Encounter (Signed)
Called patient to advise she is back in [place]. Serial number verified on remote box with [employer] system. Advised to call with further questions or concerns. Verbalized understanding.

## 2020-06-30 NOTE — Telephone Encounter (Signed)
Patient had device issues last night/this morning and called the Medtr[employer] tech support. When she spoke with [employer] this morning she was told that she is no longer registered with [employer] for transmissions.  Patient wanted to know if this had anything to do with her starting coverage with Medicare as of 06/28/20.  Patient wanted to make sure we still get her transmissions.  Please assist

## 2020-07-29 ENCOUNTER — Other Ambulatory Visit: Payer: Self-pay

## 2020-07-29 ENCOUNTER — Encounter: Payer: Self-pay | Admitting: Nurse Practitioner

## 2020-07-29 ENCOUNTER — Ambulatory Visit: Payer: Medicare Other | Admitting: Nurse Practitioner

## 2020-07-29 VITALS — BP 130/90 | HR 72 | Temp 98.6°F | Resp 16 | Wt 207.0 lb

## 2020-07-29 DIAGNOSIS — R350 Frequency of micturition: Secondary | ICD-10-CM | POA: Diagnosis not present

## 2020-07-29 DIAGNOSIS — N3090 Cystitis, unspecified without hematuria: Secondary | ICD-10-CM

## 2020-07-29 MED ORDER — SULFAMETHOXAZOLE-TRIMETHOPRIM 800-160 MG PO TABS: ORAL_TABLET | ORAL | 0 refills | Status: DC

## 2020-07-29 NOTE — Patient Instructions (Signed)
Urinary Tract Infection, Adult  A urinary tract infection (UTI) is an infection of any part of the urinary tract. The urinary tract includes the kidneys, ureters, bladder, and urethra. These organs make, store, and get rid of urine in the body. An upper UTI affects the ureters and kidneys. A lower UTI affects the bladder and urethra. What are the causes? Most urinary tract infections are caused by bacteria in your genital area around your urethra, where urine leaves your body. These bacteria grow and cause inflammation of your urinary tract. What increases the risk? You are more likely to develop this condition if:  You have a urinary catheter that stays in place.  You are not able to control when you urinate or have a bowel movement (incontinence).  You are female and you: ? Use a spermicide or diaphragm for birth control. ? Have low estrogen levels. ? Are pregnant.  You have certain genes that increase your risk.  You are sexually active.  You take antibiotic medicines.  You have a condition that causes your flow of urine to slow down, such as: ? An enlarged prostate, if you are female. ? Blockage in your urethra. ? A kidney stone. ? A nerve condition that affects your bladder control (neurogenic bladder). ? Not getting enough to drink, or not urinating often.  You have certain medical conditions, such as: ? Diabetes. ? A weak disease-fighting system (immunesystem). ? Sickle cell disease. ? Gout. ? Spinal cord injury. What are the signs or symptoms? Symptoms of this condition include:  Needing to urinate right away (urgency).  Frequent urination. This may include small amounts of urine each time you urinate.  Pain or burning with urination.  Blood in the urine.  Urine that smells bad or unusual.  Trouble urinating.  Cloudy urine.  Vaginal discharge, if you are female.  Pain in the abdomen or the lower back. You may also have:  Vomiting or a decreased  appetite.  Confusion.  Irritability or tiredness.  A fever or chills.  Diarrhea. The first symptom in older adults may be confusion. In some cases, they may not have any symptoms until the infection has worsened. How is this diagnosed? This condition is diagnosed based on your medical history and a physical exam. You may also have other tests, including:  Urine tests.  Blood tests.  Tests for STIs (sexually transmitted infections). If you have had more than one UTI, a cystoscopy or imaging studies may be done to determine the cause of the infections. How is this treated? Treatment for this condition includes:  Antibiotic medicine.  Over-the-counter medicines to treat discomfort.  Drinking enough water to stay hydrated. If you have frequent infections or have other conditions such as a kidney stone, you may need to see a health care provider who specializes in the urinary tract (urologist). In rare cases, urinary tract infections can cause sepsis. Sepsis is a life-threatening condition that occurs when the body responds to an infection. Sepsis is treated in the hospital with IV antibiotics, fluids, and other medicines. Follow these instructions at home: Medicines  Take over-the-counter and prescription medicines only as told by your health care provider.  If you were prescribed an antibiotic medicine, take it as told by your health care provider. Do not stop using the antibiotic even if you start to feel better. General instructions  Make sure you: ? Empty your bladder often and completely. Do not hold urine for long periods of time. ? Empty your bladder after   sex. ? Wipe from front to back after urinating or having a bowel movement if you are female. Use each tissue only one time when you wipe.  Drink enough fluid to keep your urine pale yellow.  Keep all follow-up visits. This is important.   Contact a health care provider if:  Your symptoms do not get better after 1-2  days.  Your symptoms go away and then return. Get help right away if:  You have severe pain in your back or your lower abdomen.  You have a fever or chills.  You have nausea or vomiting. Summary  A urinary tract infection (UTI) is an infection of any part of the urinary tract, which includes the kidneys, ureters, bladder, and urethra.  Most urinary tract infections are caused by bacteria in your genital area.  Treatment for this condition often includes antibiotic medicines.  If you were prescribed an antibiotic medicine, take it as told by your health care provider. Do not stop using the antibiotic even if you start to feel better.  Keep all follow-up visits. This is important. This information is not intended to replace advice given to you by your health care provider. Make sure you discuss any questions you have with your health care provider. Document Revised: 11/27/2019 Document Reviewed: 11/27/2019 Elsevier Patient Education  2021 Elsevier Inc.  

## 2020-07-29 NOTE — Progress Notes (Signed)
GYNECOLOGY  VISIT  CC:  UTI symptoms  HPI: 65 y.o. G58P0010 Divorced White or Caucasian female here for uti.   Symptoms started Wednesday, took AZO and helped. Then pain started again and work her up in middle night last night. Feels urgency, frequency (and feels incomplete emptying) slight burning with urination.  Denies any vaginal symptoms, no itching or vaginal burning  GYNECOLOGIC HISTORY: No LMP recorded. Patient has had a hysterectomy. Contraception: hysterectomy Menopausal hormone therapy: vivelle dot  Patient Active Problem List   Diagnosis Date Noted  . Edema 09/04/2018  . CHB (complete heart block) ([place]) 09/04/2018  . Bradycardia 08/29/2018  . Fatigue 08/29/2018  . Hypertension   . Diabetes mellitus     Past Medical History:  Diagnosis Date  . Allergy   . Arthritis   . Cataract   . Diabetes mellitus   . Hyperlipidemia   . Hypertension   . Osteopenia 03/2018   T score -1.1 FRAX 7% / 0.5%    Past Surgical History:  Procedure Laterality Date  . COLONOSCOPY    . DILATION AND CURETTAGE OF UTERUS    . HYSTEROSCOPY     AND D&C/FOR ENDO POLYP  . LASIK    . PACEMAKER IMPLANT N/A 09/08/2018   Procedure: PACEMAKER IMPLANT;  Surgeon: Constance Haw, MD;  Location: [place] [facility];  Service: Cardiovascular;  Laterality: N/A;  . TUBAL LIGATION  1999  . VAGINAL HYSTERECTOMY  2004    MEDS:   Current Outpatient Medications on File Prior to Visit  Medication Sig Dispense Refill  . aspirin EC 81 MG tablet Take 81 mg by mouth every evening.    Marland Kitchen atorvastatin (LIPITOR) 10 MG tablet Take 5 mg by mouth daily.    Marland Kitchen Bioflavonoid Products (ESTER-C) 500-550 MG TABS Take 1 tablet by mouth daily.    . carvedilol (COREG) 12.5 MG tablet Take 12.5 mg by mouth 2 (two) times daily with a meal.    . cetirizine (ZYRTEC) 10 MG tablet Take 10 mg by mouth every evening.    . cholecalciferol (VITAMIN D3) 25 MCG (1000 UT) tablet Take 1,000 Units by mouth daily.    Marland Kitchen CINNAMON PO  Take 1,500 mg by mouth daily. Cinnamon Complex    . estradiol (VIVELLE-DOT) 0.0375 MG/24HR PLACE 1 PATCH ONTO THE SKIN2 TIMES A WEEK 24 patch 4  . JENTADUETO 2.08-998 MG TABS Take 1 tablet by mouth 2 (two) times a day.    . lisinopril (PRINIVIL,ZESTRIL) 40 MG tablet Take 40 mg by mouth daily.    . Multiple Vitamin (MULTIVITAMIN WITH MINERALS) TABS tablet Take 1 tablet by mouth daily. Women's One-A-Day Multivitamin    . Multiple Vitamins-Minerals (ZINC PO) Take 40 mg by mouth daily.     . naproxen sodium (ALEVE) 220 MG tablet Take 220 mg by mouth 2 (two) times daily as needed (pain.).    Marland Kitchen Polyethyl Glycol-Propyl Glycol 0.4-0.3 % SOLN Place 1 drop into both eyes 2 (two) times a day.     No current facility-administered medications on file prior to visit.    ALLERGIES: Penicillins  Family History  Problem Relation Age of Onset  . Hypertension Mother   . Diabetes Mother   . Heart disease Mother 75       CABG, did not recover  . Diabetes Father   . Diabetes Brother   . Diabetes Paternal Aunt   . Colon cancer Neg Hx   . Esophageal cancer Neg Hx   . Rectal cancer Neg  Hx   . Stomach cancer Neg Hx      Review of Systems  Genitourinary: Positive for frequency.       Blood in urine, back pain, discomfort    PHYSICAL EXAMINATION:    BP 130/90   Pulse 72   Temp 98.6 F (37 C) (Oral)   Resp 16   Wt 207 lb (93.9 kg)   BMI 34.45 kg/m     General appearance: alert, cooperative, no acute distress  Neg CVAT  UA 2+ blood, 1+ Leukocytes, 1+ Protein  Assessment: Cystitis hematuria  Plan: Urinary frequency - Plan: Urinalysis,Complete w/RFL Culture  Cystitis - Plan: sulfamethoxazole-trimethoprim (BACTRIM DS) 800-160 MG tablet

## 2020-08-01 LAB — URINALYSIS, COMPLETE W/RFL CULTURE
Bilirubin Urine: NEGATIVE
Glucose, UA: NEGATIVE
Hyaline Cast: NONE SEEN /LPF
Ketones, ur: NEGATIVE
Nitrites, Initial: NEGATIVE
Specific Gravity, Urine: 1.01 (ref 1.001–1.03)
pH: 6 (ref 5.0–8.0)

## 2020-08-01 LAB — URINE CULTURE
MICRO NUMBER:: 11721167
SPECIMEN QUALITY:: ADEQUATE

## 2020-08-01 LAB — CULTURE INDICATED

## 2020-08-02 ENCOUNTER — Telehealth: Payer: Self-pay | Admitting: *Deleted

## 2020-08-02 NOTE — Telephone Encounter (Signed)
[employer] approved Rx through 04/29/2021. Patient informed.

## 2020-08-02 NOTE — Telephone Encounter (Signed)
PA done via cover my meds for vivelle dot patced 0.0375 mg, will wait for response.

## 2020-09-09 LAB — CUP PACEART REMOTE DEVICE CHECK
Battery Remaining Longevity: 74 mo
Battery Voltage: 2.98 V
Brady Statistic AP VP Percent: 0.52 %
Brady Statistic AP VS Percent: 0 %
Brady Statistic AS VP Percent: 99.46 %
Brady Statistic AS VS Percent: 0.02 %
Brady Statistic RA Percent Paced: 0.52 %
Brady Statistic RV Percent Paced: 99.98 %
Date Time Interrogation Session: 20220513040109
Implantable Lead Implant Date: 20200511
Implantable Lead Implant Date: 20200511
Implantable Lead Location: 753859
Implantable Lead Location: 753860
Implantable Lead Model: 3830
Implantable Lead Model: 5076
Implantable Pulse Generator Implant Date: 20200511
Lead Channel Impedance Value: 266 Ohm
Lead Channel Impedance Value: 304 Ohm
Lead Channel Impedance Value: 380 Ohm
Lead Channel Impedance Value: 399 Ohm
Lead Channel Pacing Threshold Amplitude: 0.5 V
Lead Channel Pacing Threshold Amplitude: 1.375 V
Lead Channel Pacing Threshold Pulse Width: 0.4 ms
Lead Channel Pacing Threshold Pulse Width: 0.4 ms
Lead Channel Sensing Intrinsic Amplitude: 2.875 mV
Lead Channel Sensing Intrinsic Amplitude: 2.875 mV
Lead Channel Sensing Intrinsic Amplitude: 6.125 mV
Lead Channel Setting Pacing Amplitude: 1.5 V
Lead Channel Setting Pacing Amplitude: 2.5 V
Lead Channel Setting Pacing Pulse Width: 1 ms
Lead Channel Setting Sensing Sensitivity: 2.8 mV

## 2020-09-12 ENCOUNTER — Ambulatory Visit (INDEPENDENT_AMBULATORY_CARE_PROVIDER_SITE_OTHER): Payer: Medicare Other

## 2020-09-12 DIAGNOSIS — I442 Atrioventricular block, complete: Secondary | ICD-10-CM

## 2020-10-04 NOTE — Progress Notes (Signed)
Remote pacemaker transmission.   

## 2020-10-21 ENCOUNTER — Ambulatory Visit: Payer: Medicare Other | Admitting: Cardiology

## 2020-10-21 ENCOUNTER — Encounter: Payer: Self-pay | Admitting: Cardiology

## 2020-10-21 ENCOUNTER — Other Ambulatory Visit: Payer: Self-pay

## 2020-10-21 VITALS — BP 140/84 | HR 60 | Ht 65.0 in | Wt 207.0 lb

## 2020-10-21 DIAGNOSIS — I442 Atrioventricular block, complete: Secondary | ICD-10-CM

## 2020-10-21 NOTE — Progress Notes (Signed)
Electrophysiology Office Note   Date:  10/21/2020   ID:  [name], [name] 09-Jul-1955, MRN [id number]  PCP:  Haywood Pao, MD  Cardiologist:  Hochrein Primary Electrophysiologist:  Naftula Donahue Meredith Leeds, MD    No chief complaint on file.    History of Present Illness: [name] is a 65 y.o. female who is being seen today for the evaluation of heart block at the request of Tisovec, Fransico Him, MD. Presenting today for electrophysiology evaluation.    She has a history of diabetes and hypertension.  She presented to the hospital May 2020 with episodes of heart block.  She is now status post Medtronic dual-chamber pacemaker implanted 09/08/2018.  Today, denies symptoms of palpitations, chest pain, shortness of breath, orthopnea, PND, lower extremity edema, claudication, dizziness, presyncope, syncope, bleeding, or neurologic sequela. The patient is tolerating medications without difficulties.  Since being seen she has done well.  She has noted no further episodes of chest pain or shortness of breath.  Stable to all of her daily activities.  Past Medical History:  Diagnosis Date   Allergy    Arthritis    Cataract    Diabetes mellitus    Hyperlipidemia    Hypertension    Osteopenia 03/2018   T score -1.1 FRAX 7% / 0.5%   Past Surgical History:  Procedure Laterality Date   COLONOSCOPY     DILATION AND CURETTAGE OF UTERUS     HYSTEROSCOPY     AND D&C/FOR ENDO POLYP   LASIK     PACEMAKER IMPLANT N/A 09/08/2018   Procedure: PACEMAKER IMPLANT;  Surgeon: Constance Haw, MD;  Location: [facility];  Service: Cardiovascular;  Laterality: N/A;   TUBAL LIGATION  1999   VAGINAL HYSTERECTOMY  2004     Current Outpatient Medications  Medication Sig Dispense Refill   aspirin EC 81 MG tablet Take 81 mg by mouth every evening.     atorvastatin (LIPITOR) 10 MG tablet Take 5 mg by mouth daily.     Bioflavonoid Products (ESTER-C) 500-550 MG TABS Take 1 tablet by mouth  daily.     carvedilol (COREG) 12.5 MG tablet Take 12.5 mg by mouth 2 (two) times daily with a meal.     cetirizine (ZYRTEC) 10 MG tablet Take 10 mg by mouth every evening.     cholecalciferol (VITAMIN D3) 25 MCG (1000 UT) tablet Take 1,000 Units by mouth daily.     CINNAMON PO Take 1,500 mg by mouth daily. Cinnamon Complex     estradiol (VIVELLE-DOT) 0.0375 MG/24HR PLACE 1 PATCH ONTO THE SKIN2 TIMES A WEEK 24 patch 4   JENTADUETO 2.08-998 MG TABS Take 1 tablet by mouth 2 (two) times a day.     lisinopril (PRINIVIL,ZESTRIL) 40 MG tablet Take 40 mg by mouth daily.     Multiple Vitamin (MULTIVITAMIN WITH MINERALS) TABS tablet Take 1 tablet by mouth daily. Women's One-A-Day Multivitamin     Multiple Vitamins-Minerals (ZINC PO) Take 40 mg by mouth daily.      naproxen sodium (ALEVE) 220 MG tablet Take 220 mg by mouth 2 (two) times daily as needed (pain.).     Polyethyl Glycol-Propyl Glycol 0.4-0.3 % SOLN Place 1 drop into both eyes 2 (two) times a day.     sulfamethoxazole-trimethoprim (BACTRIM DS) 800-160 MG tablet One PO BID x 5 days 10 tablet 0   No current facility-administered medications for this visit.    Allergies:   Penicillins   Social  History:  The patient  reports that she has never smoked. She has never used smokeless tobacco. She reports current alcohol use of about 1.0 - 2.0 standard drink of alcohol per week. She reports that she does not use drugs.   Family History:  The patient's family history includes Diabetes in her brother, father, mother, and paternal aunt; Heart disease (age of onset: 86) in her mother; Hypertension in her mother.   ROS:  Please see the history of present illness.   Otherwise, review of systems is positive for none.   All other systems are reviewed and negative.   PHYSICAL EXAM: VS:  Ht 5\' 5"  (1.651 m)   Wt 207 lb (93.9 kg)   BMI 34.45 kg/m  , BMI Body mass index is 34.45 kg/m. GEN: Well nourished, well developed, in no acute distress  HEENT: normal   Neck: no JVD, carotid bruits, or masses Cardiac: RRR; no murmurs, rubs, or gallops,no edema  Respiratory:  clear to auscultation bilaterally, normal work of breathing GI: soft, nontender, nondistended, + BS MS: no deformity or atrophy  Skin: warm and dry, device site well healed Neuro:  Strength and sensation are intact Psych: euthymic mood, full affect  EKG:  EKG is ordered today. Personal review of the ekg ordered shows AV paced  Personal review of the device interrogation today. Results in [place]: No results found for requested labs within last 8760 hours.    Lipid Panel  No results found for: CHOL, TRIG, HDL, CHOLHDL, VLDL, LDLCALC, LDLDIRECT   Wt Readings from Last 3 Encounters:  07/29/20 207 lb (93.9 kg)  03/23/20 204 lb (92.5 kg)  10/20/19 204 lb (92.5 kg)      Other studies Reviewed: Additional studies/ records that were reviewed today include: TTE 09/08/18  Review of the above records today demonstrates:   1. The left ventricle has hyperdynamic systolic function, with an ejection fraction of >65%. Left ventricular diastolic Doppler parameters are consistent with impaired relaxation.  2. The right ventricle has normal systolc function. The cavity was normal. There is no increase in right ventricular wall thickness.  3. No stenosis of the aortic valve.  4. The aortic root and ascending aorta are normal in size and structure.   ASSESSMENT AND PLAN:  1.  Complete heart block: Status post Medtronic dual-chamber pacemaker implanted 09/08/2018.  Device functioning appropriately.  No changes.    2.  Hypertension: Currently well controlled  Current medicines are reviewed at length with the patient today.   The patient does not have concerns regarding her medicines.  The following changes were made today: None  Labs/ tests ordered today include:  No orders of the defined types were placed in this encounter.    Disposition:   FU with Laveta Gilkey 12  months  Signed, Aevah Stansbery Meredith Leeds, MD  10/21/2020 1:43 PM     [place] [address] [phone] (office) [phone] (fax)

## 2020-12-12 ENCOUNTER — Ambulatory Visit (INDEPENDENT_AMBULATORY_CARE_PROVIDER_SITE_OTHER): Payer: Medicare Other

## 2020-12-12 DIAGNOSIS — I442 Atrioventricular block, complete: Secondary | ICD-10-CM

## 2020-12-12 LAB — CUP PACEART REMOTE DEVICE CHECK
Battery Remaining Longevity: 72 mo
Battery Voltage: 2.97 V
Brady Statistic AP VP Percent: 0.21 %
Brady Statistic AP VS Percent: 0 %
Brady Statistic AS VP Percent: 99.78 %
Brady Statistic AS VS Percent: 0.01 %
Brady Statistic RA Percent Paced: 0.21 %
Brady Statistic RV Percent Paced: 99.99 %
Date Time Interrogation Session: 20220811190725
Implantable Lead Implant Date: 20200511
Implantable Lead Implant Date: 20200511
Implantable Lead Location: 753859
Implantable Lead Location: 753860
Implantable Lead Model: 3830
Implantable Lead Model: 5076
Implantable Pulse Generator Implant Date: 20200511
Lead Channel Impedance Value: 266 Ohm
Lead Channel Impedance Value: 323 Ohm
Lead Channel Impedance Value: 399 Ohm
Lead Channel Impedance Value: 399 Ohm
Lead Channel Pacing Threshold Amplitude: 0.5 V
Lead Channel Pacing Threshold Amplitude: 1.125 V
Lead Channel Pacing Threshold Pulse Width: 0.4 ms
Lead Channel Pacing Threshold Pulse Width: 0.4 ms
Lead Channel Sensing Intrinsic Amplitude: 3.75 mV
Lead Channel Sensing Intrinsic Amplitude: 3.75 mV
Lead Channel Sensing Intrinsic Amplitude: 6.125 mV
Lead Channel Setting Pacing Amplitude: 1.5 V
Lead Channel Setting Pacing Amplitude: 2.5 V
Lead Channel Setting Pacing Pulse Width: 1 ms
Lead Channel Setting Sensing Sensitivity: 2.8 mV

## 2020-12-30 NOTE — Progress Notes (Signed)
Remote pacemaker transmission.   

## 2021-03-09 LAB — CUP PACEART REMOTE DEVICE CHECK
Battery Remaining Longevity: 69 mo
Battery Voltage: 2.97 V
Brady Statistic AP VP Percent: 0.55 %
Brady Statistic AP VS Percent: 0 %
Brady Statistic AS VP Percent: 99.44 %
Brady Statistic AS VS Percent: 0.01 %
Brady Statistic RA Percent Paced: 0.55 %
Brady Statistic RV Percent Paced: 99.99 %
Date Time Interrogation Session: 20221109215722
Implantable Lead Implant Date: 20200511
Implantable Lead Implant Date: 20200511
Implantable Lead Location: 753859
Implantable Lead Location: 753860
Implantable Lead Model: 3830
Implantable Lead Model: 5076
Implantable Pulse Generator Implant Date: 20200511
Lead Channel Impedance Value: 266 Ohm
Lead Channel Impedance Value: 323 Ohm
Lead Channel Impedance Value: 380 Ohm
Lead Channel Impedance Value: 399 Ohm
Lead Channel Pacing Threshold Amplitude: 0.5 V
Lead Channel Pacing Threshold Amplitude: 1.625 V
Lead Channel Pacing Threshold Pulse Width: 0.4 ms
Lead Channel Pacing Threshold Pulse Width: 0.4 ms
Lead Channel Sensing Intrinsic Amplitude: 3.25 mV
Lead Channel Sensing Intrinsic Amplitude: 3.25 mV
Lead Channel Sensing Intrinsic Amplitude: 6.125 mV
Lead Channel Setting Pacing Amplitude: 1.5 V
Lead Channel Setting Pacing Amplitude: 2.5 V
Lead Channel Setting Pacing Pulse Width: 1 ms
Lead Channel Setting Sensing Sensitivity: 2.8 mV

## 2021-03-13 ENCOUNTER — Ambulatory Visit (INDEPENDENT_AMBULATORY_CARE_PROVIDER_SITE_OTHER): Payer: Medicare Other

## 2021-03-13 DIAGNOSIS — I442 Atrioventricular block, complete: Secondary | ICD-10-CM | POA: Diagnosis not present

## 2021-03-20 NOTE — Progress Notes (Signed)
Remote pacemaker transmission.   

## 2021-03-28 ENCOUNTER — Ambulatory Visit (INDEPENDENT_AMBULATORY_CARE_PROVIDER_SITE_OTHER): Payer: Medicare Other | Admitting: Obstetrics & Gynecology

## 2021-03-28 ENCOUNTER — Other Ambulatory Visit: Payer: Self-pay

## 2021-03-28 ENCOUNTER — Encounter: Payer: Self-pay | Admitting: Obstetrics & Gynecology

## 2021-03-28 VITALS — BP 132/90 | HR 71 | Resp 16 | Ht 64.5 in | Wt 208.0 lb

## 2021-03-28 DIAGNOSIS — I10 Essential (primary) hypertension: Secondary | ICD-10-CM | POA: Diagnosis not present

## 2021-03-28 DIAGNOSIS — Z90710 Acquired absence of both cervix and uterus: Secondary | ICD-10-CM

## 2021-03-28 DIAGNOSIS — Z01419 Encounter for gynecological examination (general) (routine) without abnormal findings: Secondary | ICD-10-CM

## 2021-03-28 DIAGNOSIS — Z79890 Hormone replacement therapy (postmenopausal): Secondary | ICD-10-CM

## 2021-03-28 DIAGNOSIS — Z9289 Personal history of other medical treatment: Secondary | ICD-10-CM

## 2021-03-28 DIAGNOSIS — Z9189 Other specified personal risk factors, not elsewhere classified: Secondary | ICD-10-CM | POA: Diagnosis not present

## 2021-03-28 DIAGNOSIS — Z6835 Body mass index (BMI) 35.0-35.9, adult: Secondary | ICD-10-CM

## 2021-03-28 MED ORDER — ESTRADIOL 0.025 MG/24HR TD PTTW: 1.0000 | MEDICATED_PATCH | TRANSDERMAL | 4 refills | Status: DC

## 2021-03-28 NOTE — Progress Notes (Signed)
PA TENNANT 03/12/1956 [id number]   History:    65 y.o. G1P0A1  Stable relationship  RP:  Established patient presenting for annual gyn exam   HPI: Postmenopausal/HRT. Prior TVH in 2004.  Has been on the Vivelle patch 0.0375 mg dose twice weekly and would like to continue.  Aware of the risks of HRT to include thrombotic diseases and the breast cancer issue.  No pelvic pain.  No pain with IC.  Pap smear 02/2020 Neg.   No significant history of abnormal Pap smears.  Breasts normal.  Mammogram scheduled 03/2021. Colonoscopy 03/2019.  Very mild Osteopenia.  DEXA 03/2018 with Left Femoral Neck T score -1.1, FRAX 7% / 0.5%, all other sites normal.  BMI 35.15.  Health labs with Fam MD.  Past medical history,surgical history, family history and social history were all reviewed and documented in the EPIC chart.  Gynecologic History No LMP recorded. Patient has had a hysterectomy.  Obstetric History OB History  Gravida Para Term Preterm AB Living  1       1 0  SAB IAB Ectopic Multiple Live Births    1          # Outcome Date GA Lbr Len/2nd Weight Sex Delivery Anes PTL Lv  1 IAB              ROS: A ROS was performed and pertinent positives and negatives are included in the history.  GENERAL: No fevers or chills. HEENT: No change in vision, no earache, sore throat or sinus congestion. NECK: No pain or stiffness. CARDIOVASCULAR: No chest pain or pressure. No palpitations. PULMONARY: No shortness of breath, cough or wheeze. GASTROINTESTINAL: No abdominal pain, nausea, vomiting or diarrhea, melena or bright red blood per rectum. GENITOURINARY: No urinary frequency, urgency, hesitancy or dysuria. MUSCULOSKELETAL: No joint or muscle pain, no back pain, no recent trauma. DERMATOLOGIC: No rash, no itching, no lesions. ENDOCRINE: No polyuria, polydipsia, no heat or cold intolerance. No recent change in weight. HEMATOLOGICAL: No anemia or easy bruising or bleeding. NEUROLOGIC: No headache, seizures,  numbness, tingling or weakness. PSYCHIATRIC: No depression, no loss of interest in normal activity or change in sleep pattern.     Exam:   BP 132/90   Pulse 71   Resp 16   Ht 5' 4.5" (1.638 m)   Wt 208 lb (94.3 kg)   BMI 35.15 kg/m   Body mass index is 35.15 kg/m.  General appearance : Well developed well nourished female. No acute distress HEENT: Eyes: no retinal hemorrhage or exudates,  Neck supple, trachea midline, no carotid bruits, no thyroidmegaly Lungs: Clear to auscultation, no rhonchi or wheezes, or rib retractions  Heart: Regular rate and rhythm, no murmurs or gallops Breast:Examined in sitting and supine position were symmetrical in appearance, no palpable masses or tenderness,  no skin retraction, no nipple inversion, no nipple discharge, no skin discoloration, no axillary or supraclavicular lymphadenopathy Abdomen: no palpable masses or tenderness, no rebound or guarding Extremities: no edema or skin discoloration or tenderness  Pelvic: Vulva: Normal             Vagina: No gross lesions or discharge  Cervix/Uterus absent  Adnexa  Without masses or tenderness  Anus: Normal   Assessment/Plan:  65 y.o. female for annual exam   1. Well female exam with routine gynecological exam Postmenopausal/HRT. Prior TVH in 2004.  Has been on the Vivelle patch 0.0375 mg dose twice weekly and would like to continue.  Aware of  the risks of HRT to include thrombotic diseases and the breast cancer issue.  No pelvic pain.  No pain with IC.  Pap smear 02/2020 Neg.   No significant history of abnormal Pap smears.  Breasts normal.  Mammogram scheduled 03/2021. Colonoscopy 03/2019.  Very mild Osteopenia.  DEXA 03/2018 with Left Femoral Neck T score -1.1, FRAX 7% / 0.5%, all other sites normal.  BMI 35.15.  Health labs with Fam MD.  2. At risk for cardiovascular event  3. S/P vaginal hysterectomy  4. Postmenopausal hormone replacement therapy Postmenopausal/HRT. Prior TVH in 2004.  Has  been on the Vivelle patch 0.0375 mg dose twice weekly and would like to continue.  Aware of the risks of HRT to include thrombotic diseases and the breast cancer issue.  Will wean to Vivelle patch 0.025 twice a week this year.  Prescription sent to pharmacy.  5. Primary hypertension  6. Class 2 severe obesity due to excess calories with serious comorbidity and body mass index (BMI) of 35.0 to 35.9 in adult [facility]) Lower Calorie/Carb diet recommended.  Improve fitness.  7. Other specified personal risk factors, not elsewhere classified  8. Personal history of other medical treatment  Other orders - estradiol (VIVELLE-DOT) 0.025 MG/24HR; Place 1 patch onto the skin 2 (two) times a week.   Princess Bruins MD, 11:04 AM 03/28/2021

## 2021-03-31 ENCOUNTER — Other Ambulatory Visit: Payer: Self-pay

## 2021-04-04 ENCOUNTER — Other Ambulatory Visit: Payer: Self-pay

## 2021-04-14 ENCOUNTER — Encounter: Payer: Self-pay | Admitting: Obstetrics & Gynecology

## 2021-06-11 LAB — CUP PACEART REMOTE DEVICE CHECK
Battery Remaining Longevity: 64 mo
Battery Voltage: 2.97 V
Brady Statistic AP VP Percent: 0.59 %
Brady Statistic AP VS Percent: 0 %
Brady Statistic AS VP Percent: 99.41 %
Brady Statistic AS VS Percent: 0.01 %
Brady Statistic RA Percent Paced: 0.59 %
Brady Statistic RV Percent Paced: 99.99 %
Date Time Interrogation Session: 20230211220428
Implantable Lead Implant Date: 20200511
Implantable Lead Implant Date: 20200511
Implantable Lead Location: 753859
Implantable Lead Location: 753860
Implantable Lead Model: 3830
Implantable Lead Model: 5076
Implantable Pulse Generator Implant Date: 20200511
Lead Channel Impedance Value: 266 Ohm
Lead Channel Impedance Value: 323 Ohm
Lead Channel Impedance Value: 380 Ohm
Lead Channel Impedance Value: 380 Ohm
Lead Channel Pacing Threshold Amplitude: 0.5 V
Lead Channel Pacing Threshold Amplitude: 1.375 V
Lead Channel Pacing Threshold Pulse Width: 0.4 ms
Lead Channel Pacing Threshold Pulse Width: 0.4 ms
Lead Channel Sensing Intrinsic Amplitude: 2.875 mV
Lead Channel Sensing Intrinsic Amplitude: 2.875 mV
Lead Channel Sensing Intrinsic Amplitude: 6.125 mV
Lead Channel Setting Pacing Amplitude: 1.5 V
Lead Channel Setting Pacing Amplitude: 2.5 V
Lead Channel Setting Pacing Pulse Width: 1 ms
Lead Channel Setting Sensing Sensitivity: 2.8 mV

## 2021-06-12 ENCOUNTER — Ambulatory Visit (INDEPENDENT_AMBULATORY_CARE_PROVIDER_SITE_OTHER): Payer: Medicare Other

## 2021-06-12 DIAGNOSIS — I442 Atrioventricular block, complete: Secondary | ICD-10-CM | POA: Diagnosis not present

## 2021-06-14 NOTE — Progress Notes (Signed)
Remote pacemaker transmission.   

## 2021-06-19 ENCOUNTER — Telehealth: Payer: Self-pay | Admitting: *Deleted

## 2021-06-19 NOTE — Telephone Encounter (Signed)
PA done via cover my meds for estradiol 0.025 mg patch. [employer] approved patch, pharmacy notified as well.

## 2021-09-11 ENCOUNTER — Ambulatory Visit (INDEPENDENT_AMBULATORY_CARE_PROVIDER_SITE_OTHER): Payer: Medicare Other

## 2021-09-11 DIAGNOSIS — I442 Atrioventricular block, complete: Secondary | ICD-10-CM | POA: Diagnosis not present

## 2021-09-12 LAB — CUP PACEART REMOTE DEVICE CHECK
Battery Remaining Longevity: 58 mo
Battery Voltage: 2.96 V
Brady Statistic AP VP Percent: 0.79 %
Brady Statistic AP VS Percent: 0 %
Brady Statistic AS VP Percent: 99.17 %
Brady Statistic AS VS Percent: 0.03 %
Brady Statistic RA Percent Paced: 0.79 %
Brady Statistic RV Percent Paced: 99.97 %
Date Time Interrogation Session: 20230513032541
Implantable Lead Implant Date: 20200511
Implantable Lead Implant Date: 20200511
Implantable Lead Location: 753859
Implantable Lead Location: 753860
Implantable Lead Model: 3830
Implantable Lead Model: 5076
Implantable Pulse Generator Implant Date: 20200511
Lead Channel Impedance Value: 266 Ohm
Lead Channel Impedance Value: 323 Ohm
Lead Channel Impedance Value: 361 Ohm
Lead Channel Impedance Value: 380 Ohm
Lead Channel Pacing Threshold Amplitude: 0.5 V
Lead Channel Pacing Threshold Amplitude: 1.75 V
Lead Channel Pacing Threshold Pulse Width: 0.4 ms
Lead Channel Pacing Threshold Pulse Width: 0.4 ms
Lead Channel Sensing Intrinsic Amplitude: 3.25 mV
Lead Channel Sensing Intrinsic Amplitude: 3.25 mV
Lead Channel Sensing Intrinsic Amplitude: 6.125 mV
Lead Channel Setting Pacing Amplitude: 1.5 V
Lead Channel Setting Pacing Amplitude: 2.5 V
Lead Channel Setting Pacing Pulse Width: 1 ms
Lead Channel Setting Sensing Sensitivity: 2.8 mV

## 2021-09-28 NOTE — Progress Notes (Signed)
Remote pacemaker transmission.   

## 2021-12-01 ENCOUNTER — Encounter: Payer: Self-pay | Admitting: Cardiology

## 2021-12-01 ENCOUNTER — Ambulatory Visit: Payer: Medicare Other | Admitting: Cardiology

## 2021-12-01 VITALS — BP 134/82 | HR 70 | Ht 64.5 in | Wt 208.0 lb

## 2021-12-01 DIAGNOSIS — I442 Atrioventricular block, complete: Secondary | ICD-10-CM

## 2021-12-01 NOTE — Progress Notes (Signed)
Electrophysiology Office Note   Date:  12/01/2021   ID:  Kathleen, Nunez Nov 30, 1955, MRN [id number]  PCP:  Kathleen Pao, MD  Cardiologist:  [name] Primary Electrophysiologist:  Kathleen Pfefferle Meredith Leeds, MD    No chief complaint on file.     History of Present Illness: [name] is a 66 y.o. female who is being seen today for the evaluation of heart block at the request of Kathleen, Fransico Him, MD. Presenting today for electrophysiology evaluation.    She has a history of diabetes and hypertension.  Patient presented to the hospital May 2020 with episodes of heart block.  She is status post Medtronic dual-chamber pacemaker planted 09/08/2018.  Today, denies symptoms of palpitations, chest pain, shortness of breath, orthopnea, PND, lower extremity edema, claudication, dizziness, presyncope, syncope, bleeding, or neurologic sequela. The patient is tolerating medications without difficulties.     Past Medical History:  Diagnosis Date   Allergy    Arthritis    Cataract    Diabetes mellitus    HSV-1 infection    Hyperlipidemia    Hypertension    Osteopenia 03/2018   T score -1.1 FRAX 7% / 0.5%   Past Surgical History:  Procedure Laterality Date   COLONOSCOPY     DILATION AND CURETTAGE OF UTERUS     HYSTEROSCOPY     AND D&C/FOR ENDO POLYP   LASIK     PACEMAKER IMPLANT N/A 09/08/2018   Procedure: PACEMAKER IMPLANT;  Surgeon: Kathleen Haw, MD;  Location: [facility];  Service: Cardiovascular;  Laterality: N/A;   TUBAL LIGATION  1999   VAGINAL HYSTERECTOMY  2004     Current Outpatient Medications  Medication Sig Dispense Refill   aspirin EC 81 MG tablet Take 81 mg by mouth every evening.     atorvastatin (LIPITOR) 10 MG tablet Take 5 mg by mouth daily.     Bioflavonoid Products (ESTER-C) 500-550 MG TABS Take 1 tablet by mouth daily.     carvedilol (COREG) 12.5 MG tablet Take 12.5 mg by mouth 2 (two) times daily with a meal.     cetirizine (ZYRTEC) 10  MG tablet Take 10 mg by mouth every evening.     cholecalciferol (VITAMIN D3) 25 MCG (1000 UT) tablet Take 1,000 Units by mouth daily.     CINNAMON PO Take 1,500 mg by mouth daily. Cinnamon Complex     estradiol (VIVELLE-DOT) 0.025 MG/24HR Place 1 patch onto the skin 2 (two) times a week. 24 patch 4   JENTADUETO 2.08-998 MG TABS Take 1 tablet by mouth 2 (two) times a day.     lisinopril (PRINIVIL,ZESTRIL) 40 MG tablet Take 40 mg by mouth daily.     Multiple Vitamin (MULTIVITAMIN WITH MINERALS) TABS tablet Take 1 tablet by mouth daily. Women's One-A-Day Multivitamin     Multiple Vitamins-Minerals (ZINC PO) Take 40 mg by mouth daily.      naproxen sodium (ALEVE) 220 MG tablet Take 220 mg by mouth 2 (two) times daily as needed (pain.).     Polyethyl Glycol-Propyl Glycol 0.4-0.3 % SOLN Place 1 drop into both eyes 3 (three) times daily.     No current facility-administered medications for this visit.    Allergies:   Levofloxacin and Penicillins   Social History:  The patient  reports that she has never smoked. She has never used smokeless tobacco. She reports current alcohol use. She reports that she does not use drugs.   Family History:  The  patient's family history includes Diabetes in her brother, father, mother, and paternal aunt; Heart disease (age of onset: 66) in her mother; Hypertension in her mother.   ROS:  Please see the history of present illness.   Otherwise, review of systems is positive for none.   All other systems are reviewed and negative.   PHYSICAL EXAM: VS:  BP 134/82   Pulse 70   Ht 5' 4.5" (1.638 m)   Wt 208 lb (94.3 kg)   SpO2 98%   BMI 35.15 kg/m  , BMI Body mass index is 35.15 kg/m. GEN: Well nourished, well developed, in no acute distress  HEENT: normal  Neck: no JVD, carotid bruits, or masses Cardiac: RRR; no murmurs, rubs, or gallops,no edema  Respiratory:  clear to auscultation bilaterally, normal work of breathing GI: soft, nontender, nondistended, +  BS MS: no deformity or atrophy  Skin: warm and dry, device site well healed Neuro:  Strength and sensation are intact Psych: euthymic mood, full affect  EKG:  EKG is ordered today. Personal review of the ekg ordered shows sinus rhythm, V paced  Personal review of the device interrogation today. Results in [place]: No results found for requested labs within last 365 days.    Lipid Panel  No results found for: "CHOL", "TRIG", "HDL", "CHOLHDL", "VLDL", "LDLCALC", "LDLDIRECT"   Wt Readings from Last 3 Encounters:  12/01/21 208 lb (94.3 kg)  03/28/21 208 lb (94.3 kg)  10/21/20 207 lb (93.9 kg)      Other studies Reviewed: Additional studies/ records that were reviewed today include: TTE 09/08/18  Review of the above records today demonstrates:   1. The left ventricle has hyperdynamic systolic function, with an ejection fraction of >65%. Left ventricular diastolic Doppler parameters are consistent with impaired relaxation.  2. The right ventricle has normal systolc function. The cavity was normal. There is no increase in right ventricular wall thickness.  3. No stenosis of the aortic valve.  4. The aortic root and ascending aorta are normal in size and structure.   ASSESSMENT AND PLAN:  1.  Complete heart block: Status post Medtronic dual-chamber pacemaker implanted 09/08/2018.  Device function appropriately.  No changes.  2.  Hypertension: currently well controlled  Current medicines are reviewed at length with the patient today.   The patient does not have concerns regarding her medicines.  The following changes were made today: none  Labs/ tests ordered today include:  Orders Placed This Encounter  Procedures   EKG 12-Lead      Disposition:   FU 12 months  Signed, Kathleen Hermans Meredith Leeds, MD  12/01/2021 2:37 PM     [place] [address] [place] [address] [phone] (office) [phone] (fax)

## 2021-12-11 LAB — CUP PACEART REMOTE DEVICE CHECK
Battery Remaining Longevity: 54 mo
Battery Voltage: 2.95 V
Brady Statistic AP VP Percent: 0.46 %
Brady Statistic AP VS Percent: 0 %
Brady Statistic AS VP Percent: 99.52 %
Brady Statistic AS VS Percent: 0.02 %
Brady Statistic RA Percent Paced: 0.46 %
Brady Statistic RV Percent Paced: 99.98 %
Date Time Interrogation Session: 20230811214758
Implantable Lead Implant Date: 20200511
Implantable Lead Implant Date: 20200511
Implantable Lead Location: 753859
Implantable Lead Location: 753860
Implantable Lead Model: 3830
Implantable Lead Model: 5076
Implantable Pulse Generator Implant Date: 20200511
Lead Channel Impedance Value: 266 Ohm
Lead Channel Impedance Value: 323 Ohm
Lead Channel Impedance Value: 380 Ohm
Lead Channel Impedance Value: 380 Ohm
Lead Channel Pacing Threshold Amplitude: 0.5 V
Lead Channel Pacing Threshold Amplitude: 1.5 V
Lead Channel Pacing Threshold Pulse Width: 0.4 ms
Lead Channel Pacing Threshold Pulse Width: 0.4 ms
Lead Channel Sensing Intrinsic Amplitude: 3.25 mV
Lead Channel Sensing Intrinsic Amplitude: 3.25 mV
Lead Channel Sensing Intrinsic Amplitude: 6.125 mV
Lead Channel Setting Pacing Amplitude: 1.5 V
Lead Channel Setting Pacing Amplitude: 2.5 V
Lead Channel Setting Pacing Pulse Width: 1 ms
Lead Channel Setting Sensing Sensitivity: 2.8 mV

## 2022-03-12 ENCOUNTER — Ambulatory Visit (INDEPENDENT_AMBULATORY_CARE_PROVIDER_SITE_OTHER): Payer: Medicare Other

## 2022-03-12 DIAGNOSIS — I442 Atrioventricular block, complete: Secondary | ICD-10-CM | POA: Diagnosis not present

## 2022-03-12 LAB — CUP PACEART REMOTE DEVICE CHECK
Battery Remaining Longevity: 51 mo
Battery Voltage: 2.95 V
Brady Statistic AP VP Percent: 0.75 %
Brady Statistic AP VS Percent: 0 %
Brady Statistic AS VP Percent: 99.04 %
Brady Statistic AS VS Percent: 0.21 %
Brady Statistic RA Percent Paced: 0.79 %
Brady Statistic RV Percent Paced: 99.79 %
Date Time Interrogation Session: 20231112204017
Implantable Lead Connection Status: 753985
Implantable Lead Connection Status: 753985
Implantable Lead Implant Date: 20200511
Implantable Lead Implant Date: 20200511
Implantable Lead Location: 753859
Implantable Lead Location: 753860
Implantable Lead Model: 3830
Implantable Lead Model: 5076
Implantable Pulse Generator Implant Date: 20200511
Lead Channel Impedance Value: 266 Ohm
Lead Channel Impedance Value: 323 Ohm
Lead Channel Impedance Value: 361 Ohm
Lead Channel Impedance Value: 399 Ohm
Lead Channel Pacing Threshold Amplitude: 0.375 V
Lead Channel Pacing Threshold Amplitude: 1.5 V
Lead Channel Pacing Threshold Pulse Width: 0.4 ms
Lead Channel Pacing Threshold Pulse Width: 0.4 ms
Lead Channel Sensing Intrinsic Amplitude: 3.125 mV
Lead Channel Sensing Intrinsic Amplitude: 3.125 mV
Lead Channel Sensing Intrinsic Amplitude: 6.125 mV
Lead Channel Setting Pacing Amplitude: 1.5 V
Lead Channel Setting Pacing Amplitude: 2.5 V
Lead Channel Setting Pacing Pulse Width: 1 ms
Lead Channel Setting Sensing Sensitivity: 2.8 mV
Zone Setting Status: 755011
Zone Setting Status: 755011

## 2022-03-29 ENCOUNTER — Encounter: Payer: Self-pay | Admitting: Obstetrics & Gynecology

## 2022-03-29 ENCOUNTER — Ambulatory Visit: Payer: Medicare Other | Admitting: Obstetrics & Gynecology

## 2022-03-29 VITALS — BP 118/78 | HR 72 | Resp 16

## 2022-03-29 DIAGNOSIS — B009 Herpesviral infection, unspecified: Secondary | ICD-10-CM

## 2022-03-29 DIAGNOSIS — Z9189 Other specified personal risk factors, not elsewhere classified: Secondary | ICD-10-CM

## 2022-03-29 DIAGNOSIS — M85852 Other specified disorders of bone density and structure, left thigh: Secondary | ICD-10-CM | POA: Diagnosis not present

## 2022-03-29 DIAGNOSIS — Z79890 Hormone replacement therapy (postmenopausal): Secondary | ICD-10-CM | POA: Diagnosis not present

## 2022-03-29 DIAGNOSIS — Z01419 Encounter for gynecological examination (general) (routine) without abnormal findings: Secondary | ICD-10-CM

## 2022-03-29 DIAGNOSIS — Z90710 Acquired absence of both cervix and uterus: Secondary | ICD-10-CM

## 2022-03-29 MED ORDER — ESTRADIOL 0.025 MG/24HR TD PTTW: 1.0000 | MEDICATED_PATCH | TRANSDERMAL | 4 refills | Status: DC

## 2022-03-29 NOTE — Progress Notes (Signed)
[name] 1956/01/08 [id number]   History:    66 y.o. G1P0A1  Stable relationship   RP:  Established patient presenting for annual gyn exam for medication management   HPI: Postmenopausal/HRT. Prior TVH in 2004.  Has been on the Vivelle patch 0.025 mg dose twice weekly and would like to continue.  Aware of the risks of HRT to include thrombotic diseases and the breast cancer issue.  No pelvic pain.  No pain with IC.  Pap smear 02/2020 Neg.   No significant history of abnormal Pap smears.  Breasts normal.  Mammogram Neg 03/2021. Colonoscopy 03/2019.  Very mild Osteopenia.  DEXA 03/2018 with Left Femoral Neck T score -1.1, FRAX 7% / 0.5%, all other sites normal.  BMI 35.15 last year.  Health labs with Fam MD.    Past medical history,surgical history, family history and social history were all reviewed and documented in the EPIC chart.  Gynecologic History No LMP recorded. Patient has had a hysterectomy.  Obstetric History OB History  Gravida Para Term Preterm AB Living  1       1 0  SAB IAB Ectopic Multiple Live Births    1          # Outcome Date GA Lbr Len/2nd Weight Sex Delivery Anes PTL Lv  1 IAB              ROS: A ROS was performed and pertinent positives and negatives are included in the history. GENERAL: No fevers or chills. HEENT: No change in vision, no earache, sore throat or sinus congestion. NECK: No pain or stiffness. CARDIOVASCULAR: No chest pain or pressure. No palpitations. PULMONARY: No shortness of breath, cough or wheeze. GASTROINTESTINAL: No abdominal pain, nausea, vomiting or diarrhea, melena or bright red blood per rectum. GENITOURINARY: No urinary frequency, urgency, hesitancy or dysuria. MUSCULOSKELETAL: No joint or muscle pain, no back pain, no recent trauma. DERMATOLOGIC: No rash, no itching, no lesions. ENDOCRINE: No polyuria, polydipsia, no heat or cold intolerance. No recent change in weight. HEMATOLOGICAL: No anemia or easy bruising or bleeding.  NEUROLOGIC: No headache, seizures, numbness, tingling or weakness. PSYCHIATRIC: No depression, no loss of interest in normal activity or change in sleep pattern.     Exam:   BP 118/78   Pulse 72   Resp 16   General appearance : Well developed well nourished female. No acute distress HEENT: Eyes: no retinal hemorrhage or exudates,  Neck supple, trachea midline, no carotid bruits, no thyroidmegaly Lungs: Clear to auscultation, no rhonchi or wheezes, or rib retractions  Heart: Regular rate and rhythm, no murmurs or gallops Breast:Examined in sitting and supine position were symmetrical in appearance, no palpable masses or tenderness,  no skin retraction, no nipple inversion, no nipple discharge, no skin discoloration, no axillary or supraclavicular lymphadenopathy Abdomen: no palpable masses or tenderness, no rebound or guarding Extremities: no edema or skin discoloration or tenderness  Pelvic: Deferred   Assessment/Plan:  66 y.o. female for annual exam   1. Well female exam with routine gynecological exam Postmenopausal/HRT. Prior TVH in 2004.  Has been on the Vivelle patch 0.025 mg dose twice weekly and would like to continue.  Aware of the risks of HRT to include thrombotic diseases and the breast cancer issue.  No pelvic pain.  No pain with IC.  Pap smear 02/2020 Neg.   No significant history of abnormal Pap smears.  Breasts normal.  Mammogram Neg12/2022. Colonoscopy 03/2019.  Very mild Osteopenia.  DEXA 03/2018 with  Left Femoral Neck T score -1.1, FRAX 7% / 0.5%, all other sites normal.  BMI 35.15 last year.  Health labs with Fam MD.  2. S/P vaginal hysterectomy  3. Postmenopausal hormone replacement therapy P ostmenopausal/HRT. Prior TVH in 2004.  Has been on the Vivelle patch 0.025 mg dose twice weekly and would like to continue.  Aware of the risks of HRT to include thrombotic diseases and the breast cancer issue.  No pelvic pain.  No pain with IC.  Will continue on the same HRT  dosage, patient will attempt weaning in the course of the year.  4. Osteopenia of neck of left femur Very mild Osteopenia.  DEXA 03/2018 with Left Femoral Neck T score -1.1, FRAX 7% / 0.5%, all other sites normal. Repeat BD now at 5 years. - DG Bone Density; Future  Other orders - Coenzyme Q10 (COQ10) 100 MG CAPS - Propylene Glycol (SYSTANE BALANCE OP); Apply to eye. - estradiol (VIVELLE-DOT) 0.025 MG/24HR; Place 1 patch onto the skin 2 (two) times a week.   Princess Bruins MD, 10:15 AM

## 2022-04-01 ENCOUNTER — Encounter: Payer: Self-pay | Admitting: Obstetrics & Gynecology

## 2022-04-16 ENCOUNTER — Encounter: Payer: Self-pay | Admitting: Obstetrics & Gynecology

## 2022-04-16 NOTE — Progress Notes (Signed)
Remote pacemaker transmission.   

## 2022-06-11 ENCOUNTER — Ambulatory Visit: Payer: Medicare Other

## 2022-06-11 DIAGNOSIS — I442 Atrioventricular block, complete: Secondary | ICD-10-CM | POA: Diagnosis not present

## 2022-06-12 LAB — CUP PACEART REMOTE DEVICE CHECK
Battery Remaining Longevity: 42 mo
Battery Voltage: 2.94 V
Brady Statistic AP VP Percent: 0.22 %
Brady Statistic AP VS Percent: 0 %
Brady Statistic AS VP Percent: 99.76 %
Brady Statistic AS VS Percent: 0.02 %
Brady Statistic RA Percent Paced: 0.22 %
Brady Statistic RV Percent Paced: 99.98 %
Date Time Interrogation Session: 20240210203301
Implantable Lead Connection Status: 753985
Implantable Lead Connection Status: 753985
Implantable Lead Implant Date: 20200511
Implantable Lead Implant Date: 20200511
Implantable Lead Location: 753859
Implantable Lead Location: 753860
Implantable Lead Model: 3830
Implantable Lead Model: 5076
Implantable Pulse Generator Implant Date: 20200511
Lead Channel Impedance Value: 266 Ohm
Lead Channel Impedance Value: 323 Ohm
Lead Channel Impedance Value: 361 Ohm
Lead Channel Impedance Value: 380 Ohm
Lead Channel Pacing Threshold Amplitude: 0.5 V
Lead Channel Pacing Threshold Amplitude: 1.375 V
Lead Channel Pacing Threshold Pulse Width: 0.4 ms
Lead Channel Pacing Threshold Pulse Width: 0.4 ms
Lead Channel Sensing Intrinsic Amplitude: 3.125 mV
Lead Channel Sensing Intrinsic Amplitude: 3.125 mV
Lead Channel Sensing Intrinsic Amplitude: 6.625 mV
Lead Channel Sensing Intrinsic Amplitude: 6.625 mV
Lead Channel Setting Pacing Amplitude: 1.5 V
Lead Channel Setting Pacing Amplitude: 2.5 V
Lead Channel Setting Pacing Pulse Width: 1 ms
Lead Channel Setting Sensing Sensitivity: 2.8 mV
Zone Setting Status: 755011
Zone Setting Status: 755011

## 2022-07-24 NOTE — Progress Notes (Signed)
Remote pacemaker transmission.   

## 2022-09-10 ENCOUNTER — Ambulatory Visit (INDEPENDENT_AMBULATORY_CARE_PROVIDER_SITE_OTHER): Payer: Medicare Other

## 2022-09-10 DIAGNOSIS — I442 Atrioventricular block, complete: Secondary | ICD-10-CM

## 2022-09-10 LAB — CUP PACEART REMOTE DEVICE CHECK
Battery Remaining Longevity: 37 mo
Battery Voltage: 2.94 V
Brady Statistic AP VP Percent: 1.03 %
Brady Statistic AP VS Percent: 0 %
Brady Statistic AS VP Percent: 98.96 %
Brady Statistic AS VS Percent: 0.01 %
Brady Statistic RA Percent Paced: 1.03 %
Brady Statistic RV Percent Paced: 99.99 %
Date Time Interrogation Session: 20240513021020
Implantable Lead Connection Status: 753985
Implantable Lead Connection Status: 753985
Implantable Lead Implant Date: 20200511
Implantable Lead Implant Date: 20200511
Implantable Lead Location: 753859
Implantable Lead Location: 753860
Implantable Lead Model: 3830
Implantable Lead Model: 5076
Implantable Pulse Generator Implant Date: 20200511
Lead Channel Impedance Value: 266 Ohm
Lead Channel Impedance Value: 323 Ohm
Lead Channel Impedance Value: 380 Ohm
Lead Channel Impedance Value: 380 Ohm
Lead Channel Pacing Threshold Amplitude: 0.5 V
Lead Channel Pacing Threshold Amplitude: 1.25 V
Lead Channel Pacing Threshold Pulse Width: 0.4 ms
Lead Channel Pacing Threshold Pulse Width: 0.4 ms
Lead Channel Sensing Intrinsic Amplitude: 3.375 mV
Lead Channel Sensing Intrinsic Amplitude: 3.375 mV
Lead Channel Sensing Intrinsic Amplitude: 6.625 mV
Lead Channel Sensing Intrinsic Amplitude: 6.625 mV
Lead Channel Setting Pacing Amplitude: 1.5 V
Lead Channel Setting Pacing Amplitude: 2.5 V
Lead Channel Setting Pacing Pulse Width: 1 ms
Lead Channel Setting Sensing Sensitivity: 2.8 mV
Zone Setting Status: 755011
Zone Setting Status: 755011

## 2022-10-03 NOTE — Progress Notes (Signed)
Remote pacemaker transmission.   

## 2022-12-10 ENCOUNTER — Ambulatory Visit: Payer: Medicare Other

## 2022-12-10 DIAGNOSIS — I442 Atrioventricular block, complete: Secondary | ICD-10-CM

## 2022-12-10 LAB — CUP PACEART REMOTE DEVICE CHECK
Battery Remaining Longevity: 31 mo
Battery Voltage: 2.93 V
Brady Statistic AP VP Percent: 0.09 %
Brady Statistic AP VS Percent: 0 %
Brady Statistic AS VP Percent: 99.9 %
Brady Statistic AS VS Percent: 0 %
Brady Statistic RA Percent Paced: 0.09 %
Brady Statistic RV Percent Paced: 100 %
Date Time Interrogation Session: 20240812022131
Implantable Lead Connection Status: 753985
Implantable Lead Connection Status: 753985
Implantable Lead Implant Date: 20200511
Implantable Lead Implant Date: 20200511
Implantable Lead Location: 753859
Implantable Lead Location: 753860
Implantable Lead Model: 3830
Implantable Lead Model: 5076
Implantable Pulse Generator Implant Date: 20200511
Lead Channel Impedance Value: 266 Ohm
Lead Channel Impedance Value: 304 Ohm
Lead Channel Impedance Value: 361 Ohm
Lead Channel Impedance Value: 380 Ohm
Lead Channel Pacing Threshold Amplitude: 0.5 V
Lead Channel Pacing Threshold Amplitude: 1.375 V
Lead Channel Pacing Threshold Pulse Width: 0.4 ms
Lead Channel Pacing Threshold Pulse Width: 0.4 ms
Lead Channel Sensing Intrinsic Amplitude: 3.375 mV
Lead Channel Sensing Intrinsic Amplitude: 3.375 mV
Lead Channel Sensing Intrinsic Amplitude: 6.625 mV
Lead Channel Sensing Intrinsic Amplitude: 6.625 mV
Lead Channel Setting Pacing Amplitude: 1.5 V
Lead Channel Setting Pacing Amplitude: 2.5 V
Lead Channel Setting Pacing Pulse Width: 1 ms
Lead Channel Setting Sensing Sensitivity: 2.8 mV
Zone Setting Status: 755011
Zone Setting Status: 755011

## 2022-12-24 NOTE — Progress Notes (Signed)
Remote pacemaker transmission.   

## 2023-01-04 ENCOUNTER — Encounter: Payer: Medicare Other | Admitting: Cardiology

## 2023-02-06 ENCOUNTER — Telehealth: Payer: Self-pay | Admitting: Cardiology

## 2023-02-06 ENCOUNTER — Encounter: Payer: Self-pay | Admitting: Cardiology

## 2023-02-06 ENCOUNTER — Other Ambulatory Visit (HOSPITAL_COMMUNITY): Payer: Self-pay

## 2023-02-06 ENCOUNTER — Ambulatory Visit: Payer: Medicare Other | Attending: Cardiology | Admitting: Cardiology

## 2023-02-06 VITALS — BP 154/86 | HR 63 | Ht 64.0 in | Wt 203.4 lb

## 2023-02-06 DIAGNOSIS — Z79899 Other long term (current) drug therapy: Secondary | ICD-10-CM

## 2023-02-06 DIAGNOSIS — I442 Atrioventricular block, complete: Secondary | ICD-10-CM

## 2023-02-06 DIAGNOSIS — I10 Essential (primary) hypertension: Secondary | ICD-10-CM

## 2023-02-06 MED ORDER — CARVEDILOL 25 MG PO TABS
25.0000 mg | ORAL_TABLET | Freq: Two times a day (BID) | ORAL | 3 refills | Status: DC
  Filled 2023-02-06 – 2023-03-16 (×2): qty 180, 90d supply, fill #0
  Filled 2023-06-10: qty 180, 90d supply, fill #1
  Filled 2023-09-09: qty 180, 90d supply, fill #2
  Filled 2023-12-07: qty 180, 90d supply, fill #3

## 2023-02-06 MED ORDER — AMLODIPINE BESYLATE 10 MG PO TABS
10.0000 mg | ORAL_TABLET | Freq: Every day | ORAL | 11 refills | Status: DC
Start: 2023-02-09 — End: 2024-01-10
  Filled 2023-02-06: qty 30, 30d supply, fill #0
  Filled 2023-03-05: qty 30, 30d supply, fill #1
  Filled 2023-04-01: qty 30, 30d supply, fill #2
  Filled 2023-04-30: qty 30, 30d supply, fill #3
  Filled 2023-05-28: qty 30, 30d supply, fill #4
  Filled 2023-06-25: qty 30, 30d supply, fill #5
  Filled 2023-07-25: qty 30, 30d supply, fill #6
  Filled 2023-08-20: qty 30, 30d supply, fill #7
  Filled 2023-09-18: qty 30, 30d supply, fill #8
  Filled 2023-10-18: qty 30, 30d supply, fill #9
  Filled 2023-11-12: qty 30, 30d supply, fill #10
  Filled 2023-12-11 (×2): qty 30, 30d supply, fill #11

## 2023-02-06 NOTE — Telephone Encounter (Signed)
Spoke to Erskine at [facility]. 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  Informed that we decreased Lisinopril to 40 mg ONCE daily, increase Carvedilol to 25 mg BID, started Amlodipine 10 mg daily and checked a BMP to ensure kidneys are doing ok after the high dose of Lisinopril.

## 2023-02-06 NOTE — Patient Instructions (Addendum)
Medication Instructions:  Your physician has recommended you make the following change in your medication:  DECREASE Lisinopril to 40 mg ONCE daily INCREASE Carvedilol (Coreg) to 25 mg twice daily START Norvasc (Amlodipine) 10 mg once daily -- start this Saturday 10/12  *If you need a refill on your cardiac medications before your next appointment, please call your pharmacy*   Lab Work: Today: BMET If you have labs (blood work) drawn today and your tests are completely normal, you will receive your results only by: MyChart Message (if you have MyChart) OR A paper copy in the mail If you have any lab test that is abnormal or we need to change your treatment, we will call you to review the results.   Testing/Procedures: None ordered   Follow-Up: At [facility], you and your health needs are our priority.  As part of our continuing mission to provide you with exceptional heart care, we have created designated Provider Care Teams.  These Care Teams include your primary Cardiologist (physician) and Advanced Practice Providers (APPs -  Physician Assistants and Nurse Practitioners) who all work together to provide you with the care you need, when you need it.  Your next appointment:   1 year(s)  The format for your next appointment:   In Person  Provider:   You will see one of the following Advanced Practice Providers on your designated Care Team:   Francis Dowse, [place] "Mardelle Matte" Palmer, [place] Canary Brim, NP    Thank you for choosing [facility]!!   Dory Horn, RN [phone]

## 2023-02-06 NOTE — Telephone Encounter (Signed)
Caller [name]) stated she is returning RN Sherri's call.  Caller noted that she will be leaving for the day and left the following message:  "Dr. Wylene Simmer stated he had patient on the correct dosage of the lisinopril (PRINIVIL,ZESTRIL) 40 MG tablet and Dr. Elberta Fortis is welcome to take over her medication as he sees fit for her blood pressure."

## 2023-02-06 NOTE — Progress Notes (Signed)
Electrophysiology Office Note:   Date:  02/06/2023  ID:  [name], DOB [birth year], MRN [id number]  Primary Cardiologist: Rollene Rotunda, MD Electrophysiologist: Regan Lemming, MD      History of Present Illness:   [name] is a 67 y.o. female with h/o diabetes, hypertension, heart block seen today for routine electrophysiology followup.   Since last being seen in our clinic the patient reports doing well.  She has no chest pain or shortness of breath.  Is able to do all her daily activities.  She has noticed that her blood pressures are elevated.  They have been in the 150s to 170s over the last few months.  She does not have headaches or blurry vision.  she denies chest pain, palpitations, dyspnea, PND, orthopnea, nausea, vomiting, dizziness, syncope, edema, weight gain, or early satiety.   Review of systems complete and found to be negative unless listed in HPI.      EP Information / Studies Reviewed:    EKG is ordered today. Personal review as below.  EKG Interpretation Date/Time:  Wednesday February 06 2023 11:17:34 EDT Ventricular Rate:  63 PR Interval:  244 QRS Duration:  86 QT Interval:  396 QTC Calculation: 405 R Axis:   2  Text Interpretation: Atrial-sensed ventricular-paced rhythm with prolonged AV conduction When compared with ECG of 08-Sep-2018 10:07, No significant change since last tracing Confirmed by Camarion Weier ([id number]) on 02/06/2023 11:36:46 AM   PPM Interrogation-  reviewed in detail today,  See PACEART report.  Device History: Medtronic Dual Chamber PPM implanted 09/08/2018 for CHB  Risk Assessment/Calculations:         Physical Exam:   VS:  BP (!) 154/86   Pulse 63   Ht 5\' 4"  (1.626 m)   Wt 203 lb 6.4 oz (92.3 kg)   SpO2 98%   BMI 34.91 kg/m    Wt Readings from Last 3 Encounters:  02/06/23 203 lb 6.4 oz (92.3 kg)  12/01/21 208 lb (94.3 kg)  03/28/21 208 lb (94.3 kg)     GEN: Well nourished, well developed in no acute  distress NECK: No JVD; No carotid bruits CARDIAC: Regular rate and rhythm, no murmurs, rubs, gallops RESPIRATORY:  Clear to auscultation without rales, wheezing or rhonchi  ABDOMEN: Soft, non-tender, non-distended EXTREMITIES:  No edema; No deformity   ASSESSMENT AND PLAN:    CHB s/p Medtronic PPM  Normal PPM function See Pace Art report No changes today  2.  Hypertension: Significantly elevated.  Start Norvasc 10 mg and increase carvedilol to 25 mg.  [name] have her follow-up in hypertension pharmacy clinic.  [name] check BMP today.  Disposition:   Follow up with EP APP in 12 months  Signed, Liset Mcmonigle Jorja Loa, MD

## 2023-02-06 NOTE — Telephone Encounter (Signed)
[name] states she is returning a call from [place], Charity fundraiser. She says her call can be returned to [phone] (ask or [name] or [place]). She mentions that she will be leaving in the next hour.

## 2023-02-07 LAB — BASIC METABOLIC PANEL
BUN/Creatinine Ratio: 16 (ref 12–28)
BUN: 16 mg/dL (ref 8–27)
CO2: 22 mmol/L (ref 20–29)
Calcium: 10.3 mg/dL (ref 8.7–10.3)
Chloride: 104 mmol/L (ref 96–106)
Creatinine, Ser: 1.01 mg/dL — ABNORMAL HIGH (ref 0.57–1.00)
Glucose: 107 mg/dL — ABNORMAL HIGH (ref 70–99)
Potassium: 4.9 mmol/L (ref 3.5–5.2)
Sodium: 142 mmol/L (ref 134–144)
eGFR: 61 mL/min/{1.73_m2} (ref >59)

## 2023-03-06 ENCOUNTER — Other Ambulatory Visit (HOSPITAL_COMMUNITY): Payer: Self-pay

## 2023-03-11 ENCOUNTER — Ambulatory Visit: Payer: Medicare Other

## 2023-03-11 DIAGNOSIS — I442 Atrioventricular block, complete: Secondary | ICD-10-CM | POA: Diagnosis not present

## 2023-03-12 LAB — CUP PACEART REMOTE DEVICE CHECK
Battery Remaining Longevity: 28 mo
Battery Voltage: 2.93 V
Brady Statistic AP VP Percent: 3.54 %
Brady Statistic AP VS Percent: 0 %
Brady Statistic AS VP Percent: 96.4 %
Brady Statistic AS VS Percent: 0.05 %
Brady Statistic RA Percent Paced: 3.57 %
Brady Statistic RV Percent Paced: 99.95 %
Date Time Interrogation Session: 20241110220505
Implantable Lead Connection Status: 753985
Implantable Lead Connection Status: 753985
Implantable Lead Implant Date: 20200511
Implantable Lead Implant Date: 20200511
Implantable Lead Location: 753859
Implantable Lead Location: 753860
Implantable Lead Model: 3830
Implantable Lead Model: 5076
Implantable Pulse Generator Implant Date: 20200511
Lead Channel Impedance Value: 266 Ohm
Lead Channel Impedance Value: 323 Ohm
Lead Channel Impedance Value: 361 Ohm
Lead Channel Impedance Value: 380 Ohm
Lead Channel Pacing Threshold Amplitude: 0.5 V
Lead Channel Pacing Threshold Amplitude: 2 V
Lead Channel Pacing Threshold Pulse Width: 0.4 ms
Lead Channel Pacing Threshold Pulse Width: 0.4 ms
Lead Channel Sensing Intrinsic Amplitude: 3.875 mV
Lead Channel Sensing Intrinsic Amplitude: 3.875 mV
Lead Channel Sensing Intrinsic Amplitude: 6.625 mV
Lead Channel Sensing Intrinsic Amplitude: 6.625 mV
Lead Channel Setting Pacing Amplitude: 1.5 V
Lead Channel Setting Pacing Amplitude: 2.5 V
Lead Channel Setting Pacing Pulse Width: 1 ms
Lead Channel Setting Sensing Sensitivity: 2.8 mV
Zone Setting Status: 755011
Zone Setting Status: 755011

## 2023-03-18 ENCOUNTER — Other Ambulatory Visit (HOSPITAL_COMMUNITY): Payer: Self-pay

## 2023-03-19 ENCOUNTER — Other Ambulatory Visit (HOSPITAL_COMMUNITY): Payer: Self-pay

## 2023-04-03 NOTE — Progress Notes (Signed)
Remote pacemaker transmission.   

## 2023-04-04 ENCOUNTER — Encounter: Payer: Self-pay | Admitting: Obstetrics and Gynecology

## 2023-04-04 ENCOUNTER — Ambulatory Visit: Payer: Medicare Other | Admitting: Obstetrics & Gynecology

## 2023-04-04 ENCOUNTER — Ambulatory Visit (INDEPENDENT_AMBULATORY_CARE_PROVIDER_SITE_OTHER): Payer: Medicare Other | Admitting: Obstetrics and Gynecology

## 2023-04-04 VITALS — BP 120/80 | HR 69 | Ht 64.5 in | Wt 198.0 lb

## 2023-04-04 DIAGNOSIS — Z90710 Acquired absence of both cervix and uterus: Secondary | ICD-10-CM | POA: Insufficient documentation

## 2023-04-04 DIAGNOSIS — B009 Herpesviral infection, unspecified: Secondary | ICD-10-CM | POA: Diagnosis not present

## 2023-04-04 DIAGNOSIS — M85852 Other specified disorders of bone density and structure, left thigh: Secondary | ICD-10-CM | POA: Diagnosis not present

## 2023-04-04 DIAGNOSIS — Z9189 Other specified personal risk factors, not elsewhere classified: Secondary | ICD-10-CM | POA: Insufficient documentation

## 2023-04-04 DIAGNOSIS — Z01419 Encounter for gynecological examination (general) (routine) without abnormal findings: Secondary | ICD-10-CM | POA: Insufficient documentation

## 2023-04-04 NOTE — Patient Instructions (Signed)
For patients under 50-67yo, I recommend 1200mg  calcium daily and 600IU of vitamin D daily. For patients over 67yo, I recommend 1200mg  calcium daily and 800IU of vitamin D daily.  Health Maintenance, Female Adopting a healthy lifestyle and getting preventive care are important in promoting health and wellness. Ask your health care provider about: The right schedule for you to have regular tests and exams. Things you can do on your own to prevent diseases and keep yourself healthy. What should I know about diet, weight, and exercise? Eat a healthy diet  Eat a diet that includes plenty of vegetables, fruits, low-fat dairy products, and lean protein. Do not eat a lot of foods that are high in solid fats, added sugars, or sodium. Maintain a healthy weight Body mass index (BMI) is used to identify weight problems. It estimates body fat based on height and weight. Your health care provider can help determine your BMI and help you achieve or maintain a healthy weight. Get regular exercise Get regular exercise. This is one of the most important things you can do for your health. Most adults should: Exercise for at least 150 minutes each week. The exercise should increase your heart rate and make you sweat (moderate-intensity exercise). Do strengthening exercises at least twice a week. This is in addition to the moderate-intensity exercise. Spend less time sitting. Even light physical activity can be beneficial. Watch cholesterol and blood lipids Have your blood tested for lipids and cholesterol at 67 years of age, then have this test every 5 years. Have your cholesterol levels checked more often if: Your lipid or cholesterol levels are high. You are older than 67 years of age. You are at high risk for heart disease. What should I know about cancer screening? Depending on your health history and family history, you may need to have cancer screening at various ages. This may include screening  for: Breast cancer. Cervical cancer. Colorectal cancer. Skin cancer. Lung cancer. What should I know about heart disease, diabetes, and high blood pressure? Blood pressure and heart disease High blood pressure causes heart disease and increases the risk of stroke. This is more likely to develop in people who have high blood pressure readings or are overweight. Have your blood pressure checked: Every 3-5 years if you are 83-67 years of age. Every year if you are 4 years old or older. Diabetes Have regular diabetes screenings. This checks your fasting blood sugar level. Have the screening done: Once every three years after age 68 if you are at a normal weight and have a low risk for diabetes. More often and at a younger age if you are overweight or have a high risk for diabetes. What should I know about preventing infection? Hepatitis B If you have a higher risk for hepatitis B, you should be screened for this virus. Talk with your health care provider to find out if you are at risk for hepatitis B infection. Hepatitis C Testing is recommended for: Everyone born from [birth year] through 1965. Anyone with known risk factors for hepatitis C. Sexually transmitted infections (STIs) Get screened for STIs, including gonorrhea and chlamydia, if: You are sexually active and are younger than 67 years of age. You are older than 67 years of age and your health care provider tells you that you are at risk for this type of infection. Your sexual activity has changed since you were last screened, and you are at increased risk for chlamydia or gonorrhea. Ask your health care provider if  you are at risk. Ask your health care provider about whether you are at high risk for HIV. Your health care provider may recommend a prescription medicine to help prevent HIV infection. If you choose to take medicine to prevent HIV, you should first get tested for HIV. You should then be tested every 3 months for as long as you  are taking the medicine. Osteoporosis and menopause Osteoporosis is a disease in which the bones lose minerals and strength with aging. This can result in bone fractures. If you are 44 years old or older, or if you are at risk for osteoporosis and fractures, ask your health care provider if you should: Be screened for bone loss. Take a calcium or vitamin D supplement to lower your risk of fractures. Be given hormone replacement therapy (HRT) to treat symptoms of menopause. Follow these instructions at home: Alcohol use Do not drink alcohol if: Your health care provider tells you not to drink. You are pregnant, may be pregnant, or are planning to become pregnant. If you drink alcohol: Limit how much you have to: 0-1 drink a day. Know how much alcohol is in your drink. In the [place] one drink equals one 12 oz bottle of beer (355 mL), one 5 oz glass of wine (148 mL), or one 1 oz glass of hard liquor (44 mL). Lifestyle Do not use any products that contain nicotine or tobacco. These products include cigarettes, chewing tobacco, and vaping devices, such as e-cigarettes. If you need help quitting, ask your health care provider. Do not use street drugs. Do not share needles. Ask your health care provider for help if you need support or information about quitting drugs. General instructions Schedule regular health, dental, and eye exams. Stay current with your vaccines. Tell your health care provider if: You often feel depressed. You have ever been abused or do not feel safe at home. Summary Adopting a healthy lifestyle and getting preventive care are important in promoting health and wellness. Follow your health care provider's instructions about healthy diet, exercising, and getting tested or screened for diseases. Follow your health care provider's instructions on monitoring your cholesterol and blood pressure. This information is not intended to replace advice given to you by your health  care provider. Make sure you discuss any questions you have with your health care provider. Document Revised: 09/05/2020 Document Reviewed: 09/05/2020 Elsevier Patient Education  2024 [employer].

## 2023-04-04 NOTE — Assessment & Plan Note (Signed)
Cervical cancer screening not indicated, s/p TVH Encouraged annual mammogram screening Colonoscopy UTD DXA due, ordered Labs and immunizations with her primary Encouraged safe sexual practices as indicated Encouraged healthy lifestyle practices with diet and exercise For patients under 50-67yo, I recommend 1200mg  calcium daily and 600IU of vitamin D daily.  Reviewed risk of HRT, CV risk 14%. Patient tapered to half patch over past year. Recommend tapering to 1/2 patch weekly x3 month ans discontinuing. In agreement. No rx sent as patient has enough for taper, 6 patches.

## 2023-04-04 NOTE — Assessment & Plan Note (Signed)
 Continue vitamin D+Calcium Encouraged weight based exercise DXA due

## 2023-04-04 NOTE — Progress Notes (Signed)
67 y.o. G1P0010 postmenopausal female s/p TVH with osteopenia, on HRT here for annual exam.   Reports using 1/2 estrogen patch 2x a week.  Postmenopausal bleeding: none Pelvic discharge or pain: none Breast mass, nipple discharge or skin changes : none Last PAP: No results found for: "DIAGPAP", "HPVHIGH", "ADEQPAP" Last mammogram: 04/13/22 BIRADS 1, density b Last colonoscopy: 2020, q22yr Last DXA: 2019 Sexually active: yes  Exercising: yes, occassionally, dancing  GYN HISTORY: S/p TVH  OB History  Gravida Para Term Preterm AB Living  1       1 0  SAB IAB Ectopic Multiple Live Births    1          # Outcome Date GA Lbr Len/2nd Weight Sex Type Anes PTL Lv  1 IAB             Past Medical History:  Diagnosis Date   Allergy    Arthritis    Cataract    Diabetes mellitus    HSV-1 infection    Hyperlipidemia    Hypertension    Osteopenia 03/2018   T score -1.1 FRAX 7% / 0.5%    Past Surgical History:  Procedure Laterality Date   COLONOSCOPY     DILATION AND CURETTAGE OF UTERUS     HYSTEROSCOPY     AND D&C/FOR ENDO POLYP   LASIK     PACEMAKER IMPLANT N/A 09/08/2018   Procedure: PACEMAKER IMPLANT;  Surgeon: Regan Lemming, MD;  Location: [facility];  Service: Cardiovascular;  Laterality: N/A;   TUBAL LIGATION  1999   VAGINAL HYSTERECTOMY  2004    Current Outpatient Medications on File Prior to Visit  Medication Sig Dispense Refill   amLODipine (NORVASC) 10 MG tablet Take 1 tablet (10 mg total) by mouth daily. 30 tablet 11   aspirin EC 81 MG tablet Take 81 mg by mouth every evening.     atorvastatin (LIPITOR) 10 MG tablet Take 5 mg by mouth daily.     Bioflavonoid Products (ESTER-C) 500-550 MG TABS Take 1 tablet by mouth daily.     carvedilol (COREG) 25 MG tablet Take 1 tablet (25 mg total) by mouth 2 (two) times daily. 180 tablet 3   cetirizine (ZYRTEC) 10 MG tablet Take 10 mg by mouth every evening.     cholecalciferol (VITAMIN D3) 25 MCG (1000  UT) tablet Take 1,000 Units by mouth daily.     CINNAMON PO Take 1,500 mg by mouth daily. Cinnamon Complex     Coenzyme Q10 (COQ10) 100 MG CAPS      JENTADUETO 2.08-998 MG TABS Take 1 tablet by mouth 2 (two) times a day.     lisinopril (PRINIVIL,ZESTRIL) 40 MG tablet Take 40 mg by mouth daily.     Multiple Vitamin (MULTIVITAMIN WITH MINERALS) TABS tablet Take 1 tablet by mouth daily. Women's One-A-Day Multivitamin     naproxen sodium (ALEVE) 220 MG tablet Take 220 mg by mouth 2 (two) times daily as needed (pain.).     Propylene Glycol (SYSTANE BALANCE OP) Apply to eye.     No current facility-administered medications on file prior to visit.    Social History   Socioeconomic History   Marital status: Divorced    Spouse name: Not on file   Number of children: Not on file   Years of education: Not on file   Highest education level: Not on file  Occupational History   Not on file  Tobacco Use   Smoking  status: Never   Smokeless tobacco: Never  Vaping Use   Vaping status: Never Used  Substance and Sexual Activity   Alcohol use: Yes    Comment: once a month   Drug use: Never   Sexual activity: Yes    Partners: Male    Birth control/protection: Surgical    Comment: HYST-1st intercourse 67 yo-More than 5 partners  Other Topics Concern   Not on file  Social History Narrative   Lives alone.     Social Determinants of Health   Financial Resource Strain: Not on file  Food Insecurity: Not on file  Transportation Needs: Not on file  Physical Activity: Not on file  Stress: Not on file  Social Connections: Not on file  Intimate Partner Violence: Not on file    Family History  Problem Relation Age of Onset   Hypertension Mother    Diabetes Mother    Heart disease Mother 56       CABG, did not recover   Diabetes Father    Diabetes Brother    Skin cancer Brother    Diabetes Paternal Aunt    Colon cancer Neg Hx    Esophageal cancer Neg Hx    Rectal cancer Neg Hx    Stomach  cancer Neg Hx     Allergies  Allergen Reactions   Levofloxacin     Other reaction(s): myalgias   Penicillins Rash    Did it involve swelling of the face/tongue/throat, SOB, or low BP? No Did it involve sudden or severe rash/hives, skin peeling, or any reaction on the inside of your mouth or nose? No Did you need to seek medical attention at a hospital or doctor's office? No When did it last happen? 45 years ago      If all above answers are "NO", may proceed with cephalosporin use.       PE Today's Vitals   04/04/23 0956  BP: 120/80  Pulse: 69  SpO2: 99%  Weight: 198 lb (89.8 kg)  Height: 5' 4.5" (1.638 m)   Body mass index is 33.46 kg/m.  Physical Exam Vitals reviewed. Exam conducted with a chaperone present.  Constitutional:      General: She is not in acute distress.    Appearance: Normal appearance.  HENT:     Head: Normocephalic and atraumatic.     Nose: Nose normal.  Eyes:     Extraocular Movements: Extraocular movements intact.     Conjunctiva/sclera: Conjunctivae normal.  Neck:     Thyroid: No thyroid mass, thyromegaly or thyroid tenderness.  Pulmonary:     Effort: Pulmonary effort is normal.  Chest:     Chest wall: No mass or tenderness.  Breasts:    Right: Normal. No swelling, mass, nipple discharge or tenderness.     Left: Normal. No swelling, mass, nipple discharge or tenderness.  Abdominal:     General: There is no distension.     Palpations: Abdomen is soft.     Tenderness: There is no abdominal tenderness.  Genitourinary:    General: Normal vulva.     Exam position: Lithotomy position.     Urethra: No prolapse.     Vagina: Normal. No vaginal discharge or bleeding.     Cervix: No lesion.     Adnexa: Right adnexa normal and left adnexa normal.     Comments: Cervix and uterus absent Musculoskeletal:        General: Normal range of motion.     Cervical back: Normal  range of motion.  Lymphadenopathy:     Upper Body:     Right upper body: No  axillary adenopathy.     Left upper body: No axillary adenopathy.     Lower Body: No right inguinal adenopathy. No left inguinal adenopathy.  Skin:    General: Skin is warm and dry.  Neurological:     General: No focal deficit present.     Mental Status: She is alert.  Psychiatric:        Mood and Affect: Mood normal.        Behavior: Behavior normal.       Assessment and Plan:        Well woman exam with routine gynecological exam Assessment & Plan: Cervical cancer screening not indicated, s/p TVH Encouraged annual mammogram screening Colonoscopy UTD DXA due, ordered Labs and immunizations with her primary Encouraged safe sexual practices as indicated Encouraged healthy lifestyle practices with diet and exercise For patients under 50-70yo, I recommend 1200mg  calcium daily and 600IU of vitamin D daily.  Reviewed risk of HRT, CV risk 14%. Patient tapered to half patch over past year. Recommend tapering to 1/2 patch weekly x3 month ans discontinuing. In agreement. No rx sent as patient has enough for taper, 6 patches.    Osteopenia of neck of left femur Assessment & Plan: Continue vitamin D+Calcium Encouraged weight based exercise DXA due   Orders: -     DG Bone Density; Future  S/P vaginal hysterectomy    Rosalyn Gess, MD

## 2023-04-10 ENCOUNTER — Encounter: Payer: Self-pay | Admitting: Obstetrics and Gynecology

## 2023-04-11 NOTE — Telephone Encounter (Signed)
Form filled out and placed on GH's desk for authorization.

## 2023-04-18 ENCOUNTER — Encounter: Payer: Self-pay | Admitting: Obstetrics and Gynecology

## 2023-05-02 ENCOUNTER — Other Ambulatory Visit (HOSPITAL_COMMUNITY): Payer: Self-pay

## 2023-05-02 ENCOUNTER — Other Ambulatory Visit: Payer: Self-pay

## 2023-05-02 MED ORDER — LISINOPRIL 40 MG PO TABS
40.0000 mg | ORAL_TABLET | Freq: Every day | ORAL | 3 refills | Status: DC
  Filled 2023-05-02: qty 90, 90d supply, fill #0
  Filled 2023-07-25: qty 90, 90d supply, fill #1
  Filled 2023-10-23: qty 90, 90d supply, fill #2
  Filled 2024-01-21: qty 90, 90d supply, fill #3

## 2023-05-02 MED ORDER — JENTADUETO 2.5-1000 MG PO TABS
1.0000 | ORAL_TABLET | Freq: Two times a day (BID) | ORAL | 4 refills | Status: DC
  Filled 2023-05-02: qty 180, 90d supply, fill #0
  Filled 2023-07-25: qty 180, 90d supply, fill #1
  Filled 2023-10-23: qty 180, 90d supply, fill #2
  Filled 2024-01-21: qty 180, 90d supply, fill #3

## 2023-05-02 MED ORDER — ONETOUCH VERIO VI STRP
1.0000 | ORAL_STRIP | Freq: Two times a day (BID) | 3 refills | Status: DC
  Filled 2023-05-02: qty 200, 100d supply, fill #0
  Filled 2023-08-05: qty 200, 100d supply, fill #1
  Filled 2023-11-12: qty 200, 100d supply, fill #2

## 2023-05-03 ENCOUNTER — Other Ambulatory Visit (HOSPITAL_COMMUNITY): Payer: Self-pay

## 2023-05-28 ENCOUNTER — Other Ambulatory Visit (HOSPITAL_COMMUNITY): Payer: Self-pay

## 2023-06-10 ENCOUNTER — Ambulatory Visit (INDEPENDENT_AMBULATORY_CARE_PROVIDER_SITE_OTHER): Payer: Medicare Other

## 2023-06-10 DIAGNOSIS — I442 Atrioventricular block, complete: Secondary | ICD-10-CM | POA: Diagnosis not present

## 2023-06-11 LAB — CUP PACEART REMOTE DEVICE CHECK
Battery Remaining Longevity: 25 mo
Battery Voltage: 2.92 V
Brady Statistic AP VP Percent: 2.9 %
Brady Statistic AP VS Percent: 0 %
Brady Statistic AS VP Percent: 97.05 %
Brady Statistic AS VS Percent: 0.06 %
Brady Statistic RA Percent Paced: 2.92 %
Brady Statistic RV Percent Paced: 99.94 %
Date Time Interrogation Session: 20250209015919
Implantable Lead Connection Status: 753985
Implantable Lead Connection Status: 753985
Implantable Lead Implant Date: 20200511
Implantable Lead Implant Date: 20200511
Implantable Lead Location: 753859
Implantable Lead Location: 753860
Implantable Lead Model: 3830
Implantable Lead Model: 5076
Implantable Pulse Generator Implant Date: 20200511
Lead Channel Impedance Value: 266 Ohm
Lead Channel Impedance Value: 323 Ohm
Lead Channel Impedance Value: 361 Ohm
Lead Channel Impedance Value: 380 Ohm
Lead Channel Pacing Threshold Amplitude: 0.375 V
Lead Channel Pacing Threshold Amplitude: 1.625 V
Lead Channel Pacing Threshold Pulse Width: 0.4 ms
Lead Channel Pacing Threshold Pulse Width: 0.4 ms
Lead Channel Sensing Intrinsic Amplitude: 3.125 mV
Lead Channel Sensing Intrinsic Amplitude: 3.125 mV
Lead Channel Sensing Intrinsic Amplitude: 6.625 mV
Lead Channel Sensing Intrinsic Amplitude: 6.625 mV
Lead Channel Setting Pacing Amplitude: 1.5 V
Lead Channel Setting Pacing Amplitude: 2.5 V
Lead Channel Setting Pacing Pulse Width: 1 ms
Lead Channel Setting Sensing Sensitivity: 2.8 mV
Zone Setting Status: 755011
Zone Setting Status: 755011

## 2023-06-26 ENCOUNTER — Other Ambulatory Visit (HOSPITAL_COMMUNITY): Payer: Self-pay

## 2023-06-26 MED ORDER — DOXYCYCLINE HYCLATE 100 MG PO TABS
100.0000 mg | ORAL_TABLET | Freq: Two times a day (BID) | ORAL | 0 refills | Status: DC
  Filled 2023-06-26: qty 20, 10d supply, fill #0

## 2023-06-26 MED ORDER — OSELTAMIVIR PHOSPHATE 75 MG PO CAPS
75.0000 mg | ORAL_CAPSULE | Freq: Two times a day (BID) | ORAL | 0 refills | Status: DC
  Filled 2023-06-26: qty 10, 5d supply, fill #0

## 2023-06-26 MED ORDER — PROMETHAZINE-DM 6.25-15 MG/5ML PO SYRP
5.0000 mL | ORAL_SOLUTION | Freq: Four times a day (QID) | ORAL | 0 refills | Status: DC | PRN
  Filled 2023-06-26: qty 200, 10d supply, fill #0

## 2023-07-15 NOTE — Addendum Note (Signed)
 Addended by: Geralyn Flash D on: 07/15/2023 10:06 AM   Modules accepted: Orders

## 2023-07-15 NOTE — Progress Notes (Signed)
 Remote pacemaker transmission.

## 2023-07-25 ENCOUNTER — Other Ambulatory Visit (HOSPITAL_COMMUNITY): Payer: Self-pay

## 2023-07-26 ENCOUNTER — Other Ambulatory Visit (HOSPITAL_COMMUNITY): Payer: Self-pay

## 2023-07-27 ENCOUNTER — Other Ambulatory Visit (HOSPITAL_COMMUNITY): Payer: Self-pay

## 2023-08-12 ENCOUNTER — Other Ambulatory Visit (HOSPITAL_COMMUNITY): Payer: Self-pay

## 2023-08-12 MED ORDER — VALACYCLOVIR HCL 1 G PO TABS
4000.0000 mg | ORAL_TABLET | Freq: Two times a day (BID) | ORAL | 2 refills | Status: AC
  Filled 2023-08-12: qty 8, 1d supply, fill #0

## 2023-09-09 ENCOUNTER — Ambulatory Visit (INDEPENDENT_AMBULATORY_CARE_PROVIDER_SITE_OTHER): Payer: Medicare Other

## 2023-09-09 DIAGNOSIS — I442 Atrioventricular block, complete: Secondary | ICD-10-CM

## 2023-09-10 LAB — CUP PACEART REMOTE DEVICE CHECK
Battery Remaining Longevity: 21 mo
Battery Voltage: 2.91 V
Brady Statistic AP VP Percent: 1.12 %
Brady Statistic AP VS Percent: 0 %
Brady Statistic AS VP Percent: 98.85 %
Brady Statistic AS VS Percent: 0.02 %
Brady Statistic RA Percent Paced: 1.13 %
Brady Statistic RV Percent Paced: 99.98 %
Date Time Interrogation Session: 20250512054451
Implantable Lead Connection Status: 753985
Implantable Lead Connection Status: 753985
Implantable Lead Implant Date: 20200511
Implantable Lead Implant Date: 20200511
Implantable Lead Location: 753859
Implantable Lead Location: 753860
Implantable Lead Model: 3830
Implantable Lead Model: 5076
Implantable Pulse Generator Implant Date: 20200511
Lead Channel Impedance Value: 266 Ohm
Lead Channel Impedance Value: 304 Ohm
Lead Channel Impedance Value: 361 Ohm
Lead Channel Impedance Value: 380 Ohm
Lead Channel Pacing Threshold Amplitude: 0.5 V
Lead Channel Pacing Threshold Amplitude: 1.25 V
Lead Channel Pacing Threshold Pulse Width: 0.4 ms
Lead Channel Pacing Threshold Pulse Width: 0.4 ms
Lead Channel Sensing Intrinsic Amplitude: 3.625 mV
Lead Channel Sensing Intrinsic Amplitude: 3.625 mV
Lead Channel Sensing Intrinsic Amplitude: 6.625 mV
Lead Channel Sensing Intrinsic Amplitude: 6.625 mV
Lead Channel Setting Pacing Amplitude: 1.5 V
Lead Channel Setting Pacing Amplitude: 2.5 V
Lead Channel Setting Pacing Pulse Width: 1 ms
Lead Channel Setting Sensing Sensitivity: 2.8 mV
Zone Setting Status: 755011
Zone Setting Status: 755011

## 2023-09-11 ENCOUNTER — Telehealth: Payer: Self-pay

## 2023-09-11 ENCOUNTER — Ambulatory Visit: Attending: Cardiology

## 2023-09-11 DIAGNOSIS — I442 Atrioventricular block, complete: Secondary | ICD-10-CM

## 2023-09-11 NOTE — Telephone Encounter (Signed)
 Alert received from [employer] for Presenting rhythm: AS/VP with fusion beats vs loss of capture; to clinic for review.  1 VHR episode, 6 beats, V>A conduction at 194 bpm.  RV Capture Threshold High, programmed output 2.50 V @ 1.00 ms. RV Threshold with intermittent high readings; known.     Remote transmission received, see below.     Reviewed report with Cornelius Dill, MDT. Need to bring patient into device clinic for testing/possible reprogramming. Patient called, device clinic apt made today 09/11/23 @ 2:30. Cornelius Dill with MDT made aware. Patient is asymptomatic.

## 2023-09-11 NOTE — Progress Notes (Signed)
 Pt brought in for suspected fusion or LOC on RV. Pt is dependent and it was a change in morphology. No changes made or necessary.

## 2023-09-16 ENCOUNTER — Ambulatory Visit: Payer: Self-pay | Admitting: Cardiology

## 2023-10-15 ENCOUNTER — Other Ambulatory Visit (HOSPITAL_COMMUNITY): Payer: Self-pay

## 2023-10-15 MED ORDER — MUPIROCIN 2 % EX OINT
1.0000 | TOPICAL_OINTMENT | Freq: Every day | CUTANEOUS | 0 refills | Status: DC
  Filled 2023-10-15: qty 22, 22d supply, fill #0

## 2023-10-23 ENCOUNTER — Other Ambulatory Visit: Payer: Self-pay

## 2023-10-23 NOTE — Addendum Note (Signed)
 Addended by: TAWNI DRILLING D on: 10/23/2023 11:26 AM   Modules accepted: Orders

## 2023-10-23 NOTE — Progress Notes (Signed)
 Remote pacemaker transmission.

## 2023-10-24 ENCOUNTER — Other Ambulatory Visit (HOSPITAL_COMMUNITY): Payer: Self-pay

## 2023-12-09 ENCOUNTER — Ambulatory Visit: Payer: Self-pay | Admitting: Cardiology

## 2023-12-09 ENCOUNTER — Ambulatory Visit (INDEPENDENT_AMBULATORY_CARE_PROVIDER_SITE_OTHER): Payer: Medicare Other

## 2023-12-09 DIAGNOSIS — I442 Atrioventricular block, complete: Secondary | ICD-10-CM | POA: Diagnosis not present

## 2023-12-09 LAB — CUP PACEART REMOTE DEVICE CHECK
Battery Remaining Longevity: 19 mo
Battery Voltage: 2.89 V
Brady Statistic AP VP Percent: 1.75 %
Brady Statistic AP VS Percent: 0 %
Brady Statistic AS VP Percent: 98.23 %
Brady Statistic AS VS Percent: 0.02 %
Brady Statistic RA Percent Paced: 1.76 %
Brady Statistic RV Percent Paced: 99.98 %
Date Time Interrogation Session: 20250810215209
Implantable Lead Connection Status: 753985
Implantable Lead Connection Status: 753985
Implantable Lead Implant Date: 20200511
Implantable Lead Implant Date: 20200511
Implantable Lead Location: 753859
Implantable Lead Location: 753860
Implantable Lead Model: 3830
Implantable Lead Model: 5076
Implantable Pulse Generator Implant Date: 20200511
Lead Channel Impedance Value: 285 Ohm
Lead Channel Impedance Value: 342 Ohm
Lead Channel Impedance Value: 380 Ohm
Lead Channel Impedance Value: 380 Ohm
Lead Channel Pacing Threshold Amplitude: 0.375 V
Lead Channel Pacing Threshold Amplitude: 1.125 V
Lead Channel Pacing Threshold Pulse Width: 0.4 ms
Lead Channel Pacing Threshold Pulse Width: 0.4 ms
Lead Channel Sensing Intrinsic Amplitude: 3.5 mV
Lead Channel Sensing Intrinsic Amplitude: 3.5 mV
Lead Channel Sensing Intrinsic Amplitude: 6.625 mV
Lead Channel Sensing Intrinsic Amplitude: 6.625 mV
Lead Channel Setting Pacing Amplitude: 1.5 V
Lead Channel Setting Pacing Amplitude: 2.5 V
Lead Channel Setting Pacing Pulse Width: 1 ms
Lead Channel Setting Sensing Sensitivity: 2.8 mV
Zone Setting Status: 755011
Zone Setting Status: 755011

## 2024-01-10 ENCOUNTER — Other Ambulatory Visit (HOSPITAL_COMMUNITY): Payer: Self-pay

## 2024-01-10 ENCOUNTER — Other Ambulatory Visit: Payer: Self-pay | Admitting: Cardiology

## 2024-01-10 DIAGNOSIS — I10 Essential (primary) hypertension: Secondary | ICD-10-CM

## 2024-01-10 DIAGNOSIS — Z79899 Other long term (current) drug therapy: Secondary | ICD-10-CM

## 2024-01-10 MED ORDER — AMLODIPINE BESYLATE 10 MG PO TABS
10.0000 mg | ORAL_TABLET | Freq: Every day | ORAL | 0 refills | Status: DC
Start: 2024-01-10 — End: 2024-02-04
  Filled 2024-01-10: qty 30, 30d supply, fill #0

## 2024-01-11 ENCOUNTER — Other Ambulatory Visit (HOSPITAL_COMMUNITY): Payer: Self-pay

## 2024-01-21 ENCOUNTER — Other Ambulatory Visit: Payer: Self-pay

## 2024-01-22 ENCOUNTER — Other Ambulatory Visit (HOSPITAL_COMMUNITY): Payer: Self-pay

## 2024-01-23 ENCOUNTER — Other Ambulatory Visit (HOSPITAL_COMMUNITY): Payer: Self-pay

## 2024-01-24 NOTE — Progress Notes (Signed)
 Remote PPM Transmission

## 2024-01-30 ENCOUNTER — Other Ambulatory Visit: Payer: Self-pay

## 2024-01-30 ENCOUNTER — Other Ambulatory Visit (HOSPITAL_COMMUNITY): Payer: Self-pay

## 2024-01-30 MED ORDER — JENTADUETO 2.5-1000 MG PO TABS: 1.0000 | ORAL_TABLET | Freq: Two times a day (BID) | ORAL | 3 refills | Status: DC

## 2024-01-30 MED ORDER — ONETOUCH VERIO VI STRP
ORAL_STRIP | 3 refills | Status: DC
  Filled 2024-01-30: qty 200, 100d supply, fill #0
  Filled 2024-05-06: qty 200, 100d supply, fill #1

## 2024-01-30 MED ORDER — CARVEDILOL 25 MG PO TABS
25.0000 mg | ORAL_TABLET | Freq: Two times a day (BID) | ORAL | 3 refills | Status: AC
  Filled 2024-03-04: qty 180, 90d supply, fill #0
  Filled 2024-06-02: qty 180, 90d supply, fill #1

## 2024-01-30 MED ORDER — LISINOPRIL 40 MG PO TABS: 40.0000 mg | ORAL_TABLET | Freq: Two times a day (BID) | ORAL | 3 refills | Status: DC

## 2024-02-04 ENCOUNTER — Other Ambulatory Visit: Payer: Self-pay | Admitting: Cardiology

## 2024-02-04 DIAGNOSIS — I10 Essential (primary) hypertension: Secondary | ICD-10-CM

## 2024-02-04 DIAGNOSIS — Z79899 Other long term (current) drug therapy: Secondary | ICD-10-CM

## 2024-02-05 ENCOUNTER — Other Ambulatory Visit (HOSPITAL_COMMUNITY): Payer: Self-pay

## 2024-02-05 MED ORDER — AMLODIPINE BESYLATE 10 MG PO TABS
10.0000 mg | ORAL_TABLET | Freq: Every day | ORAL | 0 refills | Status: DC
  Filled 2024-02-05: qty 30, 30d supply, fill #0

## 2024-02-10 NOTE — Progress Notes (Signed)
  Electrophysiology Office Note:   Date:  02/17/2024  ID:  [name], DOB June 01, 1955, MRN [id number]  Primary Cardiologist: Lynwood Schilling, MD Primary Heart Failure: None Electrophysiologist: Will Gladis Norton, MD       History of Present Illness:   [name] is a 68 y.o. female with h/o bradycardia / CHB s/p PPM, HTN, DM seen today for routine electrophysiology followup.   Since last being seen in our clinic the patient reports doing well.  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  She previously worked with the police department for approximately 30 years.  No specific device related concerns.  She denies chest pain, palpitations, dyspnea, PND, orthopnea, nausea, vomiting, dizziness, syncope, edema, weight gain, or early satiety.   Review of systems complete and found to be negative unless listed in HPI.   EP Information / Studies Reviewed:    EKG is ordered today. Personal review as below.  EKG Interpretation Date/Time:  Monday February 17 2024 11:23:21 EDT Ventricular Rate:  68 PR Interval:  236 QRS Duration:  82 QT Interval:  390 QTC Calculation: 414 R Axis:   82  Text Interpretation: Atrial-sensed ventricular-paced rhythm with prolonged AV conduction Confirmed by Aniceto Jarvis ([id number]) on 02/17/2024 11:49:15 AM   PPM Interrogation-  reviewed in detail today,  See PACEART report.  Device History: Medtronic Dual Chamber PPM implanted 09/08/2018 for CHB  Risk Assessment/Calculations:              Physical Exam:   VS:  BP 134/72   Pulse 68   Ht 5' 4.5 (1.638 m)   Wt 195 lb (88.5 kg)   SpO2 100%   BMI 32.95 kg/m    Wt Readings from Last 3 Encounters:  02/17/24 195 lb (88.5 kg)  04/04/23 198 lb (89.8 kg)  02/06/23 203 lb 6.4 oz (92.3 kg)     GEN: Well nourished, well developed in no acute distress NECK: No JVD; No carotid bruits CARDIAC: Regular rate and rhythm, no murmurs, rubs, gallops, device site within normal limits scar  well-healed /no tethering RESPIRATORY:  Clear to auscultation without rales, wheezing or rhonchi  ABDOMEN: Soft, non-tender, non-distended EXTREMITIES:  No edema; No deformity   ASSESSMENT AND PLAN:    CHB s/p Medtronic PPM  NSVT  -Normal PPM function -See Pace Art report -No changes today  Hypertension  -well controlled on current regimen    Disposition:   Follow up with Dr. Norton in 12 months  Signed, Jarvis Aniceto, NP-C, AGACNP-BC [facility] - Electrophysiology  02/17/2024, 12:48 PM

## 2024-02-17 ENCOUNTER — Other Ambulatory Visit (HOSPITAL_COMMUNITY): Payer: Self-pay

## 2024-02-17 ENCOUNTER — Encounter: Payer: Self-pay | Admitting: Pulmonary Disease

## 2024-02-17 ENCOUNTER — Ambulatory Visit: Attending: Pulmonary Disease | Admitting: Pulmonary Disease

## 2024-02-17 VITALS — BP 134/72 | HR 68 | Ht 64.5 in | Wt 195.0 lb

## 2024-02-17 DIAGNOSIS — Z79899 Other long term (current) drug therapy: Secondary | ICD-10-CM | POA: Diagnosis not present

## 2024-02-17 DIAGNOSIS — Z950 Presence of cardiac pacemaker: Secondary | ICD-10-CM

## 2024-02-17 DIAGNOSIS — I10 Essential (primary) hypertension: Secondary | ICD-10-CM | POA: Diagnosis not present

## 2024-02-17 DIAGNOSIS — I442 Atrioventricular block, complete: Secondary | ICD-10-CM

## 2024-02-17 LAB — CUP PACEART INCLINIC DEVICE CHECK
Date Time Interrogation Session: 20251020124907
Implantable Lead Connection Status: 753985
Implantable Lead Connection Status: 753985
Implantable Lead Implant Date: 20200511
Implantable Lead Implant Date: 20200511
Implantable Lead Location: 753859
Implantable Lead Location: 753860
Implantable Lead Model: 3830
Implantable Lead Model: 5076
Implantable Pulse Generator Implant Date: 20200511

## 2024-02-17 MED ORDER — AMLODIPINE BESYLATE 10 MG PO TABS
10.0000 mg | ORAL_TABLET | Freq: Every day | ORAL | 3 refills | Status: AC
  Filled 2024-02-17 – 2024-03-04 (×2): qty 90, 90d supply, fill #0
  Filled 2024-06-02: qty 90, 90d supply, fill #1

## 2024-02-17 NOTE — Patient Instructions (Addendum)
 Medication Instructions:  Your physician recommends that you continue on your current medications as directed. Please refer to the Current Medication list given to you today.   Lab Work: None ordered  Follow-Up: At [facility], you and your health needs are our priority.  As part of our continuing mission to provide you with exceptional heart care, our providers are all part of one team.  This team includes your primary Cardiologist (physician) and Advanced Practice Providers or APPs (Physician Assistants and Nurse Practitioners) who all work together to provide you with the care you need, when you need it.  Your next appointment:   1 year  Provider:   Dr Inocencio

## 2024-02-18 ENCOUNTER — Other Ambulatory Visit (HOSPITAL_COMMUNITY): Payer: Self-pay

## 2024-02-19 ENCOUNTER — Ambulatory Visit: Payer: Self-pay | Admitting: Cardiology

## 2024-03-04 ENCOUNTER — Other Ambulatory Visit (HOSPITAL_COMMUNITY): Payer: Self-pay

## 2024-03-09 ENCOUNTER — Ambulatory Visit (INDEPENDENT_AMBULATORY_CARE_PROVIDER_SITE_OTHER): Payer: Medicare Other

## 2024-03-09 DIAGNOSIS — I442 Atrioventricular block, complete: Secondary | ICD-10-CM

## 2024-03-10 ENCOUNTER — Ambulatory Visit: Payer: Self-pay | Admitting: Cardiology

## 2024-03-10 LAB — CUP PACEART REMOTE DEVICE CHECK
Battery Remaining Longevity: 16 mo
Battery Voltage: 2.87 V
Brady Statistic AP VP Percent: 0.22 %
Brady Statistic AP VS Percent: 0 %
Brady Statistic AS VP Percent: 99.78 %
Brady Statistic AS VS Percent: 0.01 %
Brady Statistic RA Percent Paced: 0.21 %
Brady Statistic RV Percent Paced: 99.99 %
Date Time Interrogation Session: 20251110011107
Implantable Lead Connection Status: 753985
Implantable Lead Connection Status: 753985
Implantable Lead Implant Date: 20200511
Implantable Lead Implant Date: 20200511
Implantable Lead Location: 753859
Implantable Lead Location: 753860
Implantable Lead Model: 3830
Implantable Lead Model: 5076
Implantable Pulse Generator Implant Date: 20200511
Lead Channel Impedance Value: 247 Ohm
Lead Channel Impedance Value: 304 Ohm
Lead Channel Impedance Value: 342 Ohm
Lead Channel Impedance Value: 361 Ohm
Lead Channel Pacing Threshold Amplitude: 0.375 V
Lead Channel Pacing Threshold Amplitude: 1.375 V
Lead Channel Pacing Threshold Pulse Width: 0.4 ms
Lead Channel Pacing Threshold Pulse Width: 0.4 ms
Lead Channel Sensing Intrinsic Amplitude: 3.25 mV
Lead Channel Sensing Intrinsic Amplitude: 3.25 mV
Lead Channel Sensing Intrinsic Amplitude: 6.625 mV
Lead Channel Sensing Intrinsic Amplitude: 6.625 mV
Lead Channel Setting Pacing Amplitude: 1.5 V
Lead Channel Setting Pacing Amplitude: 2.5 V
Lead Channel Setting Pacing Pulse Width: 1 ms
Lead Channel Setting Sensing Sensitivity: 2.8 mV
Zone Setting Status: 755011
Zone Setting Status: 755011

## 2024-03-12 NOTE — Progress Notes (Signed)
 Remote PPM Transmission

## 2024-03-13 ENCOUNTER — Other Ambulatory Visit (HOSPITAL_COMMUNITY): Payer: Self-pay

## 2024-03-23 ENCOUNTER — Other Ambulatory Visit (HOSPITAL_COMMUNITY): Payer: Self-pay

## 2024-03-23 MED ORDER — VALACYCLOVIR HCL 500 MG PO TABS
ORAL_TABLET | ORAL | 1 refills | Status: AC
  Filled 2024-03-23 – 2024-03-24 (×2): qty 8, 1d supply, fill #0
  Filled 2024-03-29: qty 8, 1d supply, fill #1
  Filled 2024-04-02: qty 8, 2d supply, fill #1

## 2024-03-24 ENCOUNTER — Other Ambulatory Visit (HOSPITAL_COMMUNITY): Payer: Self-pay

## 2024-03-25 ENCOUNTER — Other Ambulatory Visit (HOSPITAL_COMMUNITY): Payer: Self-pay

## 2024-03-28 ENCOUNTER — Other Ambulatory Visit (HOSPITAL_COMMUNITY): Payer: Self-pay

## 2024-03-31 ENCOUNTER — Other Ambulatory Visit (HOSPITAL_COMMUNITY): Payer: Self-pay

## 2024-04-01 ENCOUNTER — Other Ambulatory Visit (HOSPITAL_COMMUNITY): Payer: Self-pay

## 2024-04-06 ENCOUNTER — Encounter: Payer: Self-pay | Admitting: Obstetrics and Gynecology

## 2024-04-06 ENCOUNTER — Ambulatory Visit: Payer: Medicare Other | Admitting: Obstetrics and Gynecology

## 2024-04-06 VITALS — BP 140/72 | HR 74 | Temp 98.4°F | Ht 65.25 in | Wt 198.0 lb

## 2024-04-06 DIAGNOSIS — Z9189 Other specified personal risk factors, not elsewhere classified: Secondary | ICD-10-CM

## 2024-04-06 DIAGNOSIS — M85852 Other specified disorders of bone density and structure, left thigh: Secondary | ICD-10-CM

## 2024-04-06 DIAGNOSIS — B009 Herpesviral infection, unspecified: Secondary | ICD-10-CM

## 2024-04-06 DIAGNOSIS — Z90710 Acquired absence of both cervix and uterus: Secondary | ICD-10-CM

## 2024-04-06 DIAGNOSIS — Z1331 Encounter for screening for depression: Secondary | ICD-10-CM

## 2024-04-06 NOTE — Progress Notes (Addendum)
 68 y.o. G1P0010 postmenopausal female s/p TVH (benign, AUB-F, 2004) with osteopenia here for high risk B&P exam. Divorced. PCP: Tisovec, Richard W, MD  Discontinued HRT last year. Mild hot flashes. Overall doing well. Her partner passed in march of this year, she has noticed a change in her libido. She has good family and friend support and is staying busy with community work.  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/2024 in [facility]  PHQ-2 Total Score 0   Flowsheet Row Office Visit from 04/04/2023 in [facility]  PHQ-9 Total Score 0   GYN HISTORY: S/p TVH Medicare HR exam: more than 5 sexual partners, +hsv1   OB History  Gravida Para Term Preterm AB Living  1    1 0  SAB IAB Ectopic Multiple Live Births   1       # Outcome Date GA Lbr Len/2nd Weight Sex Type Anes PTL Lv  1 IAB             Past Medical History:  Diagnosis Date   Allergy    Arthritis    Cataract    Diabetes mellitus    HSV-1 infection    Hyperlipidemia    Hypertension    Osteopenia 03/2018   T score -1.1 FRAX 7% / 0.5%    Past Surgical History:  Procedure Laterality Date   COLONOSCOPY     DILATION AND CURETTAGE OF UTERUS     HYSTEROSCOPY     AND D&C/FOR ENDO POLYP   LASIK     PACEMAKER IMPLANT N/A 09/08/2018   Procedure: PACEMAKER IMPLANT;  Surgeon: Inocencio Soyla Lunger, MD;  Location: [facility] INVASIVE [facility];  Service: Cardiovascular;  Laterality: N/A;   TUBAL LIGATION  1999   VAGINAL HYSTERECTOMY  2004    Current Outpatient Medications on  File Prior to Visit  Medication Sig Dispense Refill   amLODipine  (NORVASC ) 10 MG tablet Take 1 tablet (10 mg total) by mouth daily. 90 tablet 3   aspirin EC 81 MG tablet Take 81 mg by mouth every evening.     atorvastatin (LIPITOR) 10 MG tablet Take 5 mg by mouth daily.     Bioflavonoid Products (ESTER-C) 500-550 MG TABS Take 1 tablet by mouth daily.     carvedilol  (COREG ) 25 MG tablet Take 1 tablet (25 mg total) by mouth 2 (two) times daily with food. 180 tablet 3   cetirizine (ZYRTEC) 10 MG tablet Take 10 mg by mouth every evening.     cholecalciferol (VITAMIN D3) 25 MCG (1000 UT) tablet Take 1,000 Units by mouth daily.     CINNAMON PO Take 1,500 mg by mouth daily. Cinnamon Complex     Coenzyme Q10 (COQ10 GUMMIES ADULT PO) CoQ10     glucose blood (ONETOUCH VERIO) test strip Use to test blood sugars twice a day 200 each 3   JENTADUETO  2.08-998 MG TABS Take 1 tablet by mouth 2 (two) times a day.     lisinopril  (PRINIVIL ,ZESTRIL ) 40 MG tablet Take 40 mg by mouth daily.     Multiple Vitamin (MULTIVITAMIN WITH MINERALS) TABS tablet Take 1  tablet by mouth daily. Women's One-A-Day Multivitamin     naproxen sodium (ALEVE) 220 MG tablet Take 220 mg by mouth 2 (two) times daily as needed (pain.).     Pediatric Multiple Vitamins (FLINSTONES GUMMIES OMEGA-3 DHA) CHEW 1 chewable Orally once a day     Propylene Glycol (SYSTANE BALANCE OP) Apply to eye.     valACYclovir  (VALTREX ) 1000 MG tablet Take 4 tablets (4,000 mg total) by mouth at onset of symptoms and 4 tablets 12 hours later 8 tablet 2   valACYclovir  (VALTREX ) 500 MG tablet TAKE 4 TABLETS BY MOUTH AT THE FIRST SIGN OF COLD SORE AND TAKE 4 TABLETS 12 HOURS LATER. 8 tablet 1   zinc sulfate 50 MG CAPS capsule      No current facility-administered medications on file prior to visit.    Social History   Socioeconomic History   Marital status: Divorced    Spouse name: Not on file   Number of children: Not on file   Years of education: Not on  file   Highest education level: Not on file  Occupational History   Not on file  Tobacco Use   Smoking status: Never   Smokeless tobacco: Never  Vaping Use   Vaping status: Never Used  Substance and Sexual Activity   Alcohol  use: Yes    Comment: once a month   Drug use: Never   Sexual activity: Yes    Partners: Male    Birth control/protection: Surgical    Comment: HYST-1st intercourse 69 yo-More than 5 partners  Other Topics Concern   Not on file  Social History Narrative   Lives alone.     Social Drivers of Corporate Investment Banker Strain: Not on file  Food Insecurity: Not on file  Transportation Needs: Not on file  Physical Activity: Not on file  Stress: Not on file  Social Connections: Not on file  Intimate Partner Violence: Not on file    Family History  Problem Relation Age of Onset   Hypertension Mother    Diabetes Mother    Heart disease Mother 64       CABG, did not recover   Diabetes Father    Diabetes Brother    Skin cancer Brother    Diabetes Paternal Aunt    Colon cancer Neg Hx    Esophageal cancer Neg Hx    Rectal cancer Neg Hx    Stomach cancer Neg Hx     Allergies  Allergen Reactions   Levofloxacin     Other reaction(s): myalgias   Penicillins Rash    Did it involve swelling of the face/tongue/throat, SOB, or low BP? No Did it involve sudden or severe rash/hives, skin peeling, or any reaction on the inside of your mouth or nose? No Did you need to seek medical attention at a hospital or doctor's office? No When did it last happen? 45 years ago      If all above answers are "NO", may proceed with cephalosporin use.       PE Today's Vitals   04/06/24 1018  BP: (!) 140/72  Pulse: 74  Temp: 98.4 F (36.9 C)  TempSrc: Oral  SpO2: 97%  Weight: 198 lb (89.8 kg)  Height: 5' 5.25 (1.657 m)    Body mass index is 32.7 kg/m.  Physical Exam Vitals reviewed. Exam conducted with a chaperone present.  Constitutional:      General:  She is not in acute distress.    Appearance:  Normal appearance.  HENT:     Head: Normocephalic and atraumatic.     Nose: Nose normal.  Eyes:     Extraocular Movements: Extraocular movements intact.     Conjunctiva/sclera: Conjunctivae normal.  Pulmonary:     Effort: Pulmonary effort is normal.  Chest:     Chest wall: No mass or tenderness.  Breasts:    Right: Normal. No swelling, mass, nipple discharge or tenderness.     Left: Normal. No swelling, mass, nipple discharge or tenderness.  Abdominal:     General: There is no distension.     Palpations: Abdomen is soft.     Tenderness: There is no abdominal tenderness.  Genitourinary:    General: Normal vulva.     Exam position: Lithotomy position.     Urethra: No prolapse.     Vagina: Normal. No vaginal discharge or bleeding.     Cervix: No lesion.     Adnexa: Right adnexa normal and left adnexa normal.     Comments: Cervix and uterus absent Musculoskeletal:        General: Normal range of motion.     Cervical back: Normal range of motion.  Lymphadenopathy:     Upper Body:     Right upper body: No axillary adenopathy.     Left upper body: No axillary adenopathy.     Lower Body: No right inguinal adenopathy. No left inguinal adenopathy.  Skin:    General: Skin is warm and dry.  Neurological:     General: No focal deficit present.     Mental Status: She is alert.  Psychiatric:        Mood and Affect: Mood normal.        Behavior: Behavior normal.       Assessment and Plan:        GYN exam for high-risk Medicare patient Assessment & Plan: Cervical cancer screening not indicated, s/p TVH Encouraged annual mammogram screening Colonoscopy UTD DXA UTD per patient, completed with PCP Labs and immunizations with her primary Encouraged safe sexual practices as indicated Encouraged healthy lifestyle practices with diet and exercise For patients under 50-70yo, I recommend 1200mg  calcium daily and 600IU of vitamin D   daily.   S/P vaginal hysterectomy  Osteopenia of neck of left femur Assessment & Plan: Continue vitamin D +Calcium Encouraged weight based exercise DXA unavailable for my review Continue management with PCP    Vera LULLA Pa, MD

## 2024-04-06 NOTE — Assessment & Plan Note (Addendum)
 Continue vitamin D +Calcium Encouraged weight based exercise DXA unavailable for my review Continue management with PCP

## 2024-04-06 NOTE — Assessment & Plan Note (Addendum)
 Cervical cancer screening not indicated, s/p TVH Encouraged annual mammogram screening Colonoscopy UTD DXA UTD per patient, completed with PCP Labs and immunizations with her primary Encouraged safe sexual practices as indicated Encouraged healthy lifestyle practices with diet and exercise For patients under 50-68yo, I recommend 1200mg  calcium daily and 600IU of vitamin D  daily.

## 2024-04-06 NOTE — Patient Instructions (Signed)
 For patients under 50-68yo, I recommend 1200mg  calcium  daily and 600IU of vitamin D daily. For patients over 68yo, I recommend 1200mg  calcium  daily and 800IU of vitamin D daily.  Health Maintenance, Female Adopting a healthy lifestyle and getting preventive care are important in promoting health and wellness. Ask your health care provider about: The right schedule for you to have regular tests and exams. Things you can do on your own to prevent diseases and keep yourself healthy. What should I know about diet, weight, and exercise? Eat a healthy diet  Eat a diet that includes plenty of vegetables, fruits, low-fat dairy products, and lean protein. Do not eat a lot of foods that are high in solid fats, added sugars, or sodium. Maintain a healthy weight Body mass index (BMI) is used to identify weight problems. It estimates body fat based on height and weight. Your health care provider can help determine your BMI and help you achieve or maintain a healthy weight. Get regular exercise Get regular exercise. This is one of the most important things you can do for your health. Most adults should: Exercise for at least 150 minutes each week. The exercise should increase your heart rate and make you sweat (moderate-intensity exercise). Do strengthening exercises at least twice a week. This is in addition to the moderate-intensity exercise. Spend less time sitting. Even light physical activity can be beneficial. Watch cholesterol and blood lipids Have your blood tested for lipids and cholesterol at 68 years of age, then have this test every 5 years. Have your cholesterol levels checked more often if: Your lipid or cholesterol levels are high. You are older than 68 years of age. You are at high risk for heart disease. What should I know about cancer screening? Depending on your health history and family history, you may need to have cancer screening at various ages. This may include screening  for: Breast cancer. Cervical cancer. Colorectal cancer. Skin cancer. Lung cancer. What should I know about heart disease, diabetes, and high blood pressure? Blood pressure and heart disease High blood pressure causes heart disease and increases the risk of stroke. This is more likely to develop in people who have high blood pressure readings or are overweight. Have your blood pressure checked: Every 3-5 years if you are 25-57 years of age. Every year if you are 24 years old or older. Diabetes Have regular diabetes screenings. This checks your fasting blood sugar level. Have the screening done: Once every three years after age 62 if you are at a normal weight and have a low risk for diabetes. More often and at a younger age if you are overweight or have a high risk for diabetes. What should I know about preventing infection? Hepatitis B If you have a higher risk for hepatitis B, you should be screened for this virus. Talk with your health care provider to find out if you are at risk for hepatitis B infection. Hepatitis C Testing is recommended for: Everyone born from [birth year] through 1965. Anyone with known risk factors for hepatitis C. Sexually transmitted infections (STIs) Get screened for STIs, including gonorrhea and chlamydia, if: You are sexually active and are younger than 68 years of age. You are older than 68 years of age and your health care provider tells you that you are at risk for this type of infection. Your sexual activity has changed since you were last screened, and you are at increased risk for chlamydia or gonorrhea. Ask your health care provider if  you are at risk. Ask your health care provider about whether you are at high risk for HIV. Your health care provider may recommend a prescription medicine to help prevent HIV infection. If you choose to take medicine to prevent HIV, you should first get tested for HIV. You should then be tested every 3 months for as long as you  are taking the medicine. Osteoporosis and menopause Osteoporosis is a disease in which the bones lose minerals and strength with aging. This can result in bone fractures. If you are 72 years old or older, or if you are at risk for osteoporosis and fractures, ask your health care provider if you should: Be screened for bone loss. Take a calcium  or vitamin D supplement to lower your risk of fractures. Be given hormone replacement therapy (HRT) to treat symptoms of menopause. Follow these instructions at home: Alcohol use Do not drink alcohol if: Your health care provider tells you not to drink. You are pregnant, may be pregnant, or are planning to become pregnant. If you drink alcohol: Limit how much you have to: 0-1 drink a day. Know how much alcohol is in your drink. In the [place] one drink equals one 12 oz bottle of beer (355 mL), one 5 oz glass of wine (148 mL), or one 1 oz glass of hard liquor (44 mL). Lifestyle Do not use any products that contain nicotine or tobacco. These products include cigarettes, chewing tobacco, and vaping devices, such as e-cigarettes. If you need help quitting, ask your health care provider. Do not use street drugs. Do not share needles. Ask your health care provider for help if you need support or information about quitting drugs. General instructions Schedule regular health, dental, and eye exams. Stay current with your vaccines. Tell your health care provider if: You often feel depressed. You have ever been abused or do not feel safe at home. Summary Adopting a healthy lifestyle and getting preventive care are important in promoting health and wellness. Follow your health care provider's instructions about healthy diet, exercising, and getting tested or screened for diseases. Follow your health care provider's instructions on monitoring your cholesterol and blood pressure. This information is not intended to replace advice given to you by your health  care provider. Make sure you discuss any questions you have with your health care provider. Document Revised: 09/05/2020 Document Reviewed: 09/05/2020 Elsevier Patient Education  2024 [employer].

## 2024-04-08 ENCOUNTER — Other Ambulatory Visit (HOSPITAL_COMMUNITY): Payer: Self-pay

## 2024-04-24 ENCOUNTER — Other Ambulatory Visit (HOSPITAL_COMMUNITY): Payer: Self-pay

## 2024-04-27 ENCOUNTER — Other Ambulatory Visit (HOSPITAL_COMMUNITY): Payer: Self-pay

## 2024-04-27 MED ORDER — LISINOPRIL 40 MG PO TABS
40.0000 mg | ORAL_TABLET | Freq: Every day | ORAL | 3 refills | Status: AC
  Filled 2024-04-27: qty 90, 90d supply, fill #0

## 2024-04-27 MED ORDER — JENTADUETO 2.5-1000 MG PO TABS
1.0000 | ORAL_TABLET | Freq: Two times a day (BID) | ORAL | 3 refills | Status: AC
  Filled 2024-04-27: qty 180, 90d supply, fill #0

## 2024-05-04 ENCOUNTER — Other Ambulatory Visit (HOSPITAL_COMMUNITY): Payer: Self-pay

## 2024-05-06 ENCOUNTER — Other Ambulatory Visit (HOSPITAL_COMMUNITY): Payer: Self-pay

## 2024-05-12 ENCOUNTER — Other Ambulatory Visit (HOSPITAL_COMMUNITY): Payer: Self-pay

## 2024-05-12 MED ORDER — CONTOUR TEST VI STRP
ORAL_STRIP | 3 refills | Status: AC
  Filled 2024-05-12: qty 200, 100d supply, fill #0

## 2024-05-12 MED ORDER — ACCU-CHEK SOFTCLIX LANCETS MISC
Freq: Two times a day (BID) | 3 refills | Status: AC
  Filled 2024-05-12: qty 200, 100d supply, fill #0

## 2024-05-12 MED ORDER — ACCU-CHEK GUIDE W/DEVICE KIT
1.0000 | PACK | Freq: Two times a day (BID) | 0 refills | Status: AC
  Filled 2024-05-12: qty 1, 30d supply, fill #0

## 2024-05-14 ENCOUNTER — Other Ambulatory Visit (HOSPITAL_COMMUNITY): Payer: Self-pay

## 2024-06-08 ENCOUNTER — Ambulatory Visit: Payer: Medicare Other

## 2024-09-07 ENCOUNTER — Ambulatory Visit: Payer: Medicare Other

## 2024-12-07 ENCOUNTER — Ambulatory Visit: Payer: Medicare Other

## 2025-03-08 ENCOUNTER — Ambulatory Visit: Payer: Medicare Other
